# Patient Record
Sex: Male | Born: 1950 | Race: White | Hispanic: No | Marital: Single | Smoking: Never smoker
Health system: Southern US, Community
[De-identification: ages and names within clinical notes are randomized; demographics above are authoritative.]

## PROBLEM LIST (undated history)

## (undated) DIAGNOSIS — C439 Malignant melanoma of skin, unspecified: Secondary | ICD-10-CM

## (undated) DIAGNOSIS — Z85820 Personal history of malignant melanoma of skin: Secondary | ICD-10-CM

## (undated) DIAGNOSIS — H409 Unspecified glaucoma: Secondary | ICD-10-CM

## (undated) DIAGNOSIS — Z86018 Personal history of other benign neoplasm: Secondary | ICD-10-CM

## (undated) DIAGNOSIS — H269 Unspecified cataract: Secondary | ICD-10-CM

## (undated) DIAGNOSIS — I10 Essential (primary) hypertension: Secondary | ICD-10-CM

## (undated) DIAGNOSIS — Z85828 Personal history of other malignant neoplasm of skin: Secondary | ICD-10-CM

## (undated) DIAGNOSIS — M1990 Unspecified osteoarthritis, unspecified site: Secondary | ICD-10-CM

## (undated) HISTORY — PX: CHOLECYSTECTOMY: SHX55

## (undated) HISTORY — PX: CORONARY ARTERY BYPASS GRAFT: SHX141

## (undated) HISTORY — PX: GLAUCOMA SURGERY: SHX656

## (undated) HISTORY — PX: AQUEOUS SHUNT: SHX6305

## (undated) HISTORY — PX: EYE SURGERY: SHX253

---
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
## 2002-11-07 DIAGNOSIS — Z85820 Personal history of malignant melanoma of skin: Secondary | ICD-10-CM

## 2002-11-07 DIAGNOSIS — C439 Malignant melanoma of skin, unspecified: Secondary | ICD-10-CM

## 2002-11-07 HISTORY — DX: Personal history of malignant melanoma of skin: Z85.820

## 2002-11-07 HISTORY — DX: Malignant melanoma of skin, unspecified: C43.9

## 2008-05-14 ENCOUNTER — Ambulatory Visit: Payer: Self-pay | Admitting: Family Medicine

## 2008-05-14 ENCOUNTER — Other Ambulatory Visit: Payer: Self-pay

## 2008-05-19 ENCOUNTER — Ambulatory Visit: Payer: Self-pay | Admitting: Urology

## 2008-05-20 ENCOUNTER — Emergency Department: Payer: Self-pay | Admitting: Urology

## 2008-08-08 ENCOUNTER — Ambulatory Visit: Payer: Self-pay | Admitting: Unknown Physician Specialty

## 2008-08-11 ENCOUNTER — Ambulatory Visit: Payer: Self-pay | Admitting: Unknown Physician Specialty

## 2008-11-07 DIAGNOSIS — Z86018 Personal history of other benign neoplasm: Secondary | ICD-10-CM

## 2008-11-07 HISTORY — DX: Personal history of other benign neoplasm: Z86.018

## 2008-11-19 ENCOUNTER — Ambulatory Visit: Payer: Self-pay | Admitting: Unknown Physician Specialty

## 2008-12-24 DIAGNOSIS — D239 Other benign neoplasm of skin, unspecified: Secondary | ICD-10-CM

## 2008-12-24 HISTORY — DX: Other benign neoplasm of skin, unspecified: D23.9

## 2009-09-24 IMAGING — CT CT ABD-PELV W/ CM
1 of 2 series · 15 of 32 positions shown, 19 images · non-contrast
Comparison: none

REASON FOR EXAM: Right and left lower quadrant abdominal pain
COMMENTS:

[Series 2: abdomen · axial · 0.67mm/px · z∈[-1131,-706]mm · 15 of 93 slices shown, 19 images]
[im 4/93  soft-tissue]
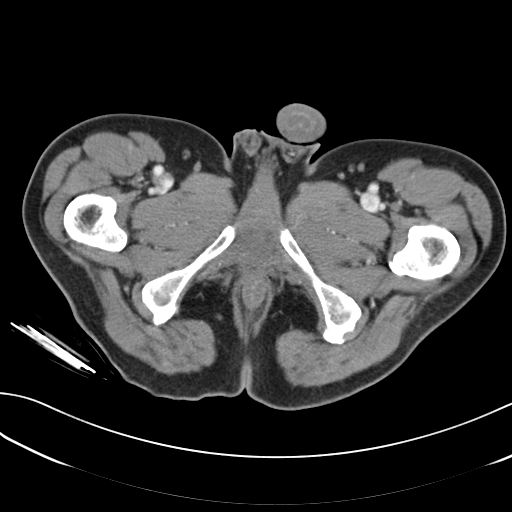
[im 4/93  bone]
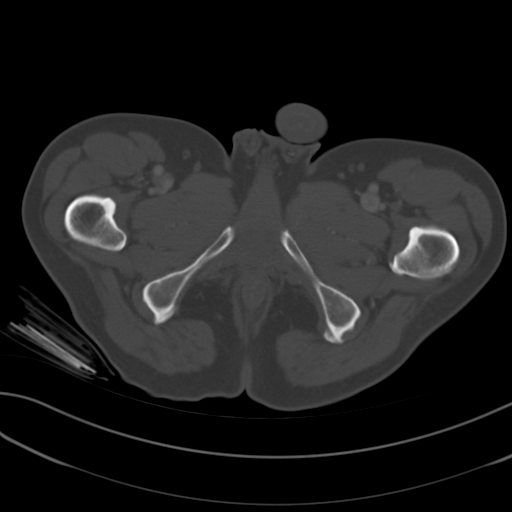
[im 12/93  soft-tissue]
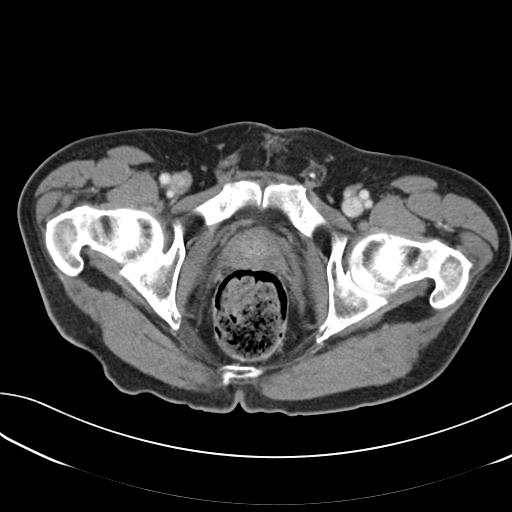
[im 20/93  soft-tissue]
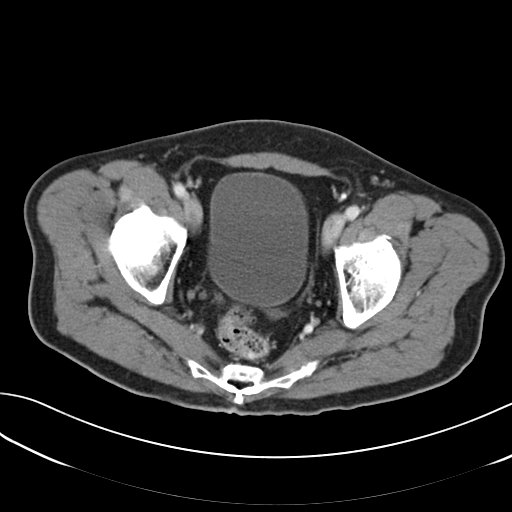
[im 27/93  soft-tissue]
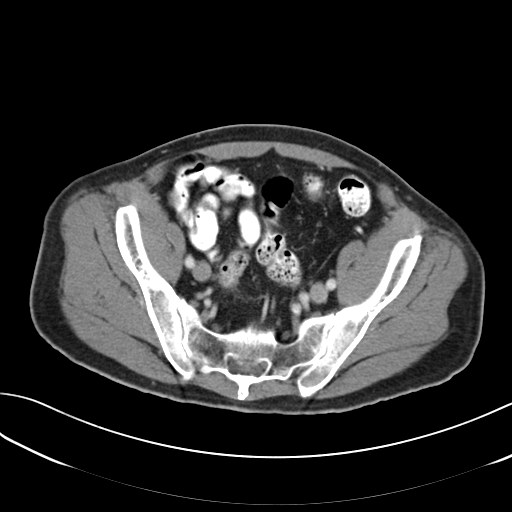
[im 31/93  soft-tissue]
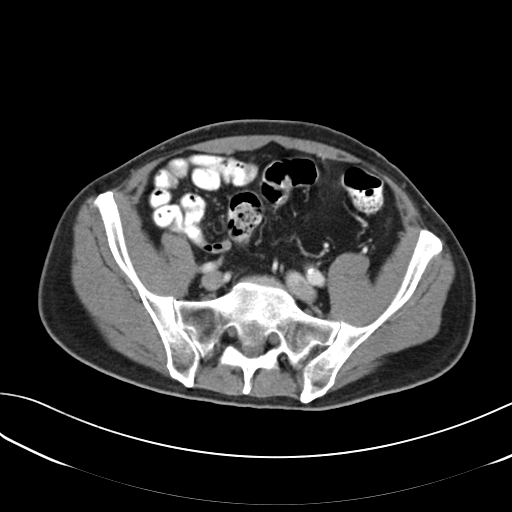
[im 39/93  soft-tissue]
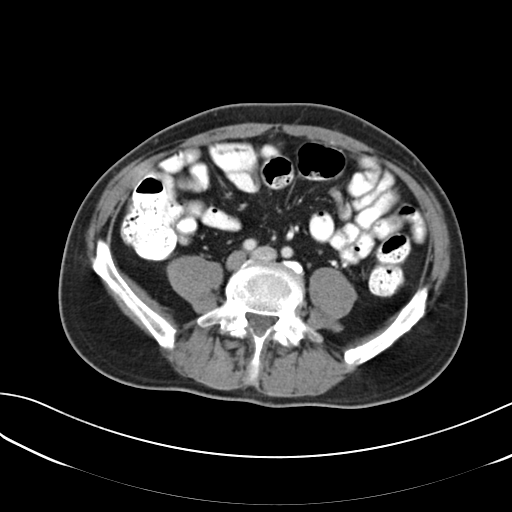
[im 47/93  soft-tissue]
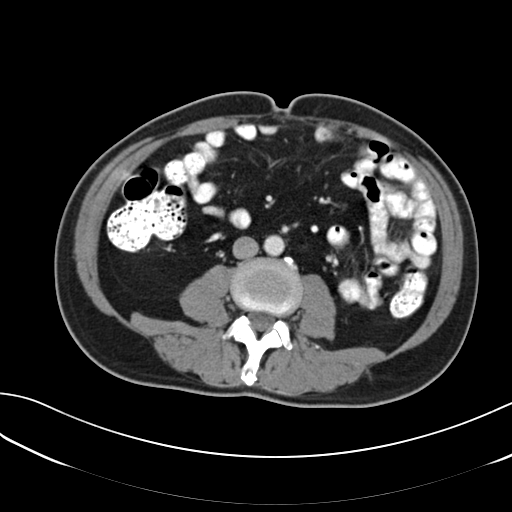
[im 54/93  soft-tissue]
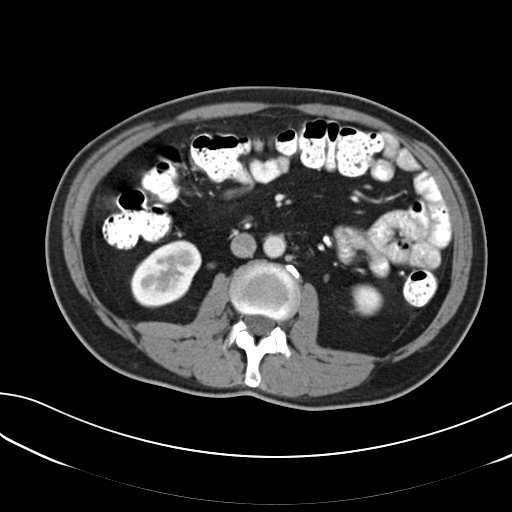
[im 62/93  soft-tissue]
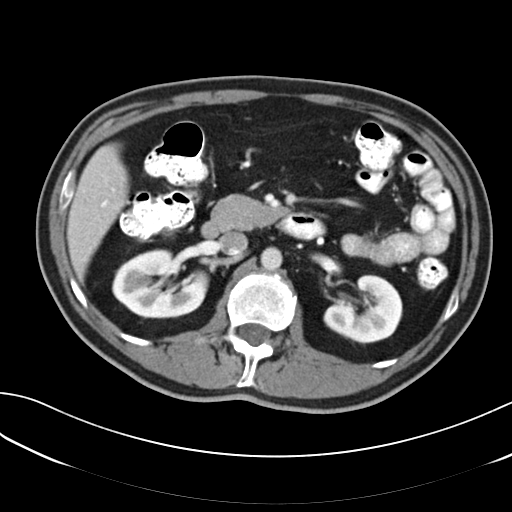
[im 62/93  bone]
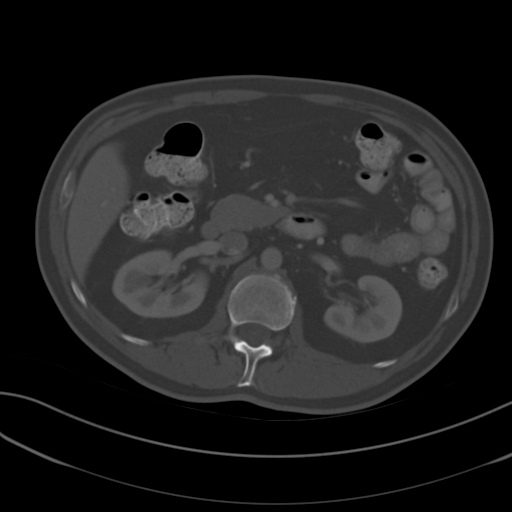
[im 66/93  soft-tissue]
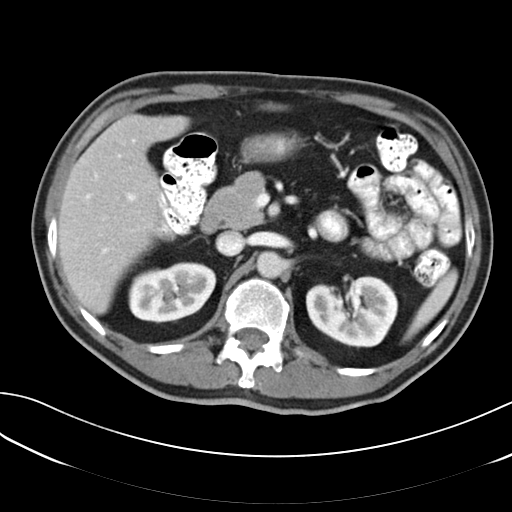
[im 73/93  soft-tissue]
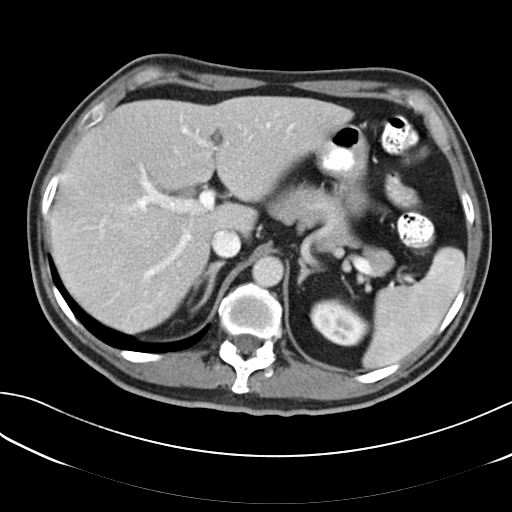
[im 77/93  lung]
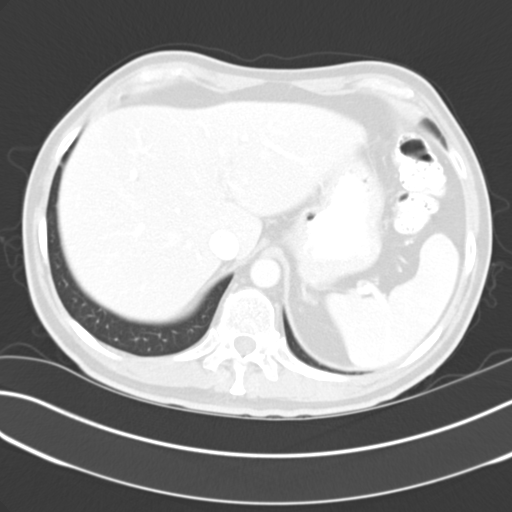
[im 81/93  soft-tissue]
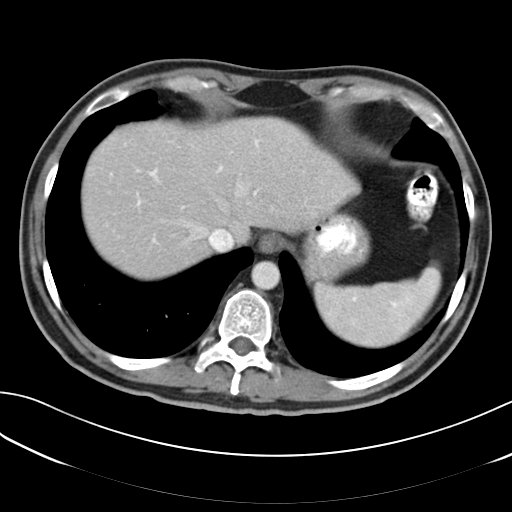
[im 81/93  lung]
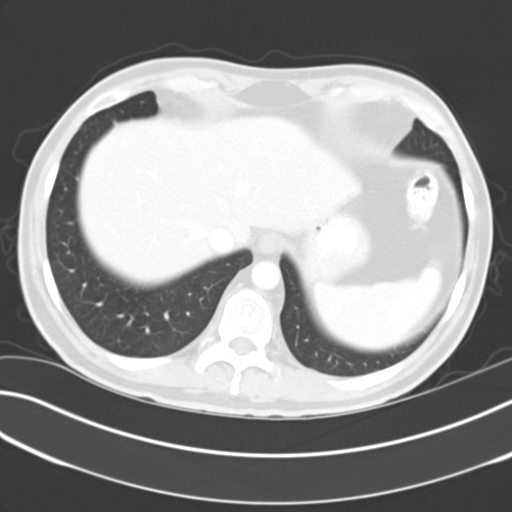
[im 85/93  lung]
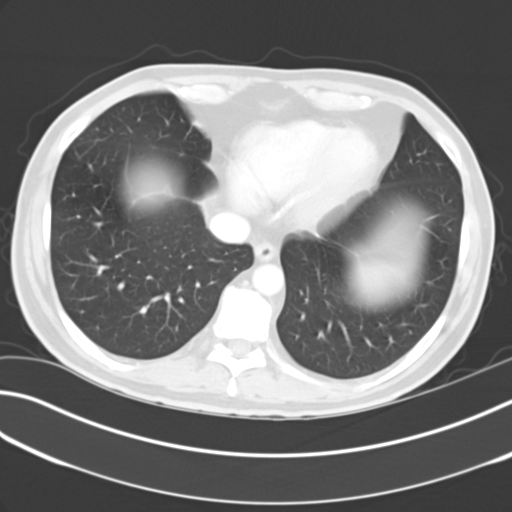
[im 89/93  soft-tissue]
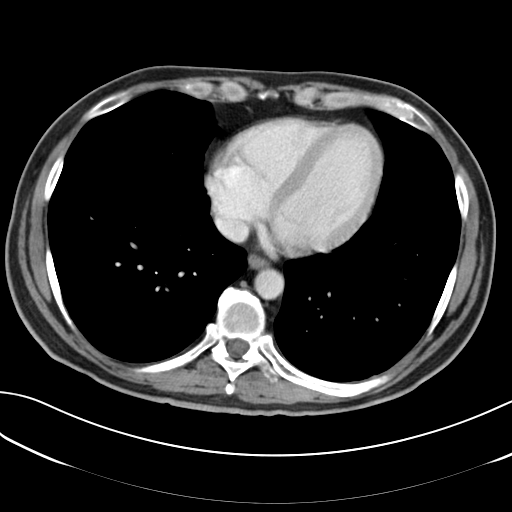
[im 89/93  lung]
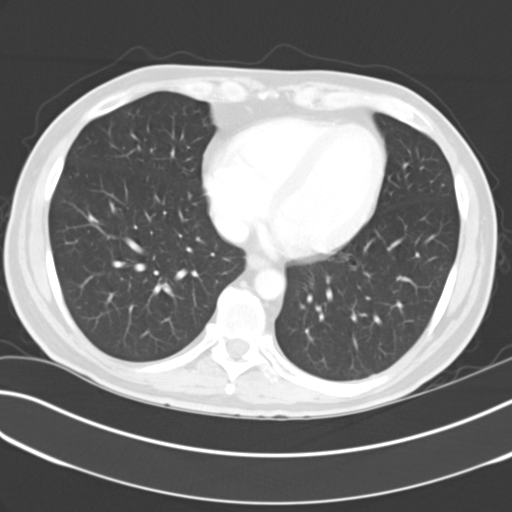

[15 of 32 positions shown; findings below may reference images not displayed]

PROCEDURE:     CT  - CT ABDOMEN / PELVIS  W  - August 11, 2008  [DATE]

RESULT:     The patient is being evaluated for unexplained constipation.

The patient received 85 ml of [id number] as well as oral contrast material.

There is a moderate amount of stool and gas and contrast within the colon.
Stool is present down to and including the rectum. I do not see abnormal
masses or wall thickening associated with the colon. There are no findings
to suggest an inguinal or umbilical hernia. The partially contrast filled
loops of small bowel are normal in appearance.

The liver exhibits normal density. There is a trace of intrahepatic ductal
dilation. The gallbladder is surgically absent. The pancreas, spleen,
adrenal glands, kidneys and partially distended stomach are normal in
appearance. The caliber of the abdominal aorta is normal. The lumbar
vertebral bodies are preserved in height. The urinary bladder, prostate
gland and seminal vesicles appear normal for age. The lung bases are clear.
IMPRESSION: 1.  I do not see evidence of bowel obstruction or bowel wall thickening. No
inguinal or umbilical hernia is evident. There is a moderate amount of stool
throughout the colon.
2.  I do not see evidence of acute hepatobiliary abnormality or acute
urinary tract abnormality.
3.  There is no evidence of ascites.
4.  Direct visualization of the colon may be useful if the patient has not
already undergone this.

## 2011-11-08 DIAGNOSIS — Z85828 Personal history of other malignant neoplasm of skin: Secondary | ICD-10-CM

## 2011-11-08 HISTORY — DX: Personal history of other malignant neoplasm of skin: Z85.828

## 2015-03-05 DIAGNOSIS — C4491 Basal cell carcinoma of skin, unspecified: Secondary | ICD-10-CM

## 2015-03-05 HISTORY — DX: Basal cell carcinoma of skin, unspecified: C44.91

## 2017-05-11 ENCOUNTER — Other Ambulatory Visit: Payer: Self-pay | Admitting: Cardiology

## 2017-05-12 ENCOUNTER — Encounter
Admission: RE | Disposition: A | Discharge status: Home / Self Care | Payer: Self-pay | Source: Ambulatory Visit | Attending: Cardiology

## 2017-05-12 ENCOUNTER — Ambulatory Visit
Admission: RE | Admit: 2017-05-12 | Discharge: 2017-05-13 | Disposition: A | Discharge status: Home / Self Care | Payer: Medicare Other | Source: Ambulatory Visit | Attending: Cardiology | Admitting: Cardiology

## 2017-05-12 DIAGNOSIS — Z7982 Long term (current) use of aspirin: Secondary | ICD-10-CM | POA: Diagnosis not present

## 2017-05-12 DIAGNOSIS — Z7902 Long term (current) use of antithrombotics/antiplatelets: Secondary | ICD-10-CM | POA: Insufficient documentation

## 2017-05-12 DIAGNOSIS — Z880 Allergy status to penicillin: Secondary | ICD-10-CM | POA: Insufficient documentation

## 2017-05-12 DIAGNOSIS — I25110 Atherosclerotic heart disease of native coronary artery with unstable angina pectoris: Secondary | ICD-10-CM | POA: Insufficient documentation

## 2017-05-12 DIAGNOSIS — E781 Pure hyperglyceridemia: Secondary | ICD-10-CM | POA: Insufficient documentation

## 2017-05-12 DIAGNOSIS — I208 Other forms of angina pectoris: Secondary | ICD-10-CM | POA: Diagnosis present

## 2017-05-12 DIAGNOSIS — R9430 Abnormal result of cardiovascular function study, unspecified: Secondary | ICD-10-CM | POA: Diagnosis not present

## 2017-05-12 DIAGNOSIS — I2510 Atherosclerotic heart disease of native coronary artery without angina pectoris: Secondary | ICD-10-CM | POA: Diagnosis not present

## 2017-05-12 DIAGNOSIS — I2089 Other forms of angina pectoris: Secondary | ICD-10-CM | POA: Diagnosis present

## 2017-05-12 HISTORY — DX: Malignant melanoma of skin, unspecified: C43.9

## 2017-05-12 HISTORY — DX: Unspecified cataract: H26.9

## 2017-05-12 HISTORY — DX: Unspecified glaucoma: H40.9

## 2017-05-12 HISTORY — DX: Unspecified osteoarthritis, unspecified site: M19.90

## 2017-05-12 HISTORY — PX: LEFT HEART CATH AND CORONARY ANGIOGRAPHY: CATH118249

## 2017-05-12 HISTORY — PX: CORONARY STENT INTERVENTION: CATH118234

## 2017-05-12 HISTORY — DX: Essential (primary) hypertension: I10

## 2017-05-12 LAB — CBC
HCT: 44 % (ref 40.0–52.0)
Hemoglobin: 15.2 g/dL (ref 13.0–18.0)
MCH: 32.1 pg (ref 26.0–34.0)
MCHC: 34.7 g/dL (ref 32.0–36.0)
MCV: 92.7 fL (ref 80.0–100.0)
PLATELETS: 193 10*3/uL (ref 150–440)
RBC: 4.75 MIL/uL (ref 4.40–5.90)
RDW: 13 % (ref 11.5–14.5)
WBC: 7.2 10*3/uL (ref 3.8–10.6)

## 2017-05-12 LAB — BASIC METABOLIC PANEL
Anion gap: 9 (ref 5–15)
BUN: 19 mg/dL (ref 6–20)
CO2: 25 mmol/L (ref 22–32)
CREATININE: 0.84 mg/dL (ref 0.61–1.24)
Calcium: 9 mg/dL (ref 8.9–10.3)
Chloride: 102 mmol/L (ref 101–111)
Glucose, Bld: 109 mg/dL — ABNORMAL HIGH (ref 65–99)
Potassium: 4.2 mmol/L (ref 3.5–5.1)
SODIUM: 136 mmol/L (ref 135–145)

## 2017-05-12 SURGERY — LEFT HEART CATH AND CORONARY ANGIOGRAPHY
Anesthesia: Moderate Sedation

## 2017-05-12 MED ORDER — SAW PALMETTO 450 MG PO CAPS: 450.0000 mg | ORAL_CAPSULE | Freq: Every day | ORAL | Status: DC

## 2017-05-12 MED ORDER — IOPAMIDOL (ISOVUE-300) INJECTION 61%
INTRAVENOUS | Status: DC | PRN
  Administered 2017-05-12: 110 mL via INTRA_ARTERIAL

## 2017-05-12 MED ORDER — ASPIRIN 81 MG PO CHEW
CHEWABLE_TABLET | ORAL | Status: DC | PRN
  Administered 2017-05-12: 324 mg via ORAL

## 2017-05-12 MED ORDER — BIVALIRUDIN TRIFLUOROACETATE 250 MG IV SOLR
INTRAVENOUS | Status: AC
  Filled 2017-05-12: qty 250

## 2017-05-12 MED ORDER — ASPIRIN 81 MG PO CHEW
81.0000 mg | CHEWABLE_TABLET | ORAL | Status: DC
Start: 2017-05-13 — End: 2017-05-12

## 2017-05-12 MED ORDER — CLOPIDOGREL BISULFATE 300 MG PO TABS
ORAL_TABLET | ORAL | Status: AC
  Filled 2017-05-12: qty 2

## 2017-05-12 MED ORDER — BRINZOLAMIDE 1 % OP SUSP
1.0000 [drp] | Freq: Three times a day (TID) | OPHTHALMIC | Status: DC
  Administered 2017-05-12 – 2017-05-13 (×3): 1 [drp] via OPHTHALMIC
  Filled 2017-05-12: qty 10

## 2017-05-12 MED ORDER — CLOPIDOGREL BISULFATE 75 MG PO TABS
ORAL_TABLET | ORAL | Status: DC | PRN
  Administered 2017-05-12: 600 mg via ORAL

## 2017-05-12 MED ORDER — NITROGLYCERIN 5 MG/ML IV SOLN
INTRAVENOUS | Status: AC
  Filled 2017-05-12: qty 10

## 2017-05-12 MED ORDER — BIVALIRUDIN BOLUS VIA INFUSION - CUPID
INTRAVENOUS | Status: DC | PRN
  Administered 2017-05-12 (×2): 56.85 mg via INTRAVENOUS

## 2017-05-12 MED ORDER — TEMAZEPAM 15 MG PO CAPS
15.0000 mg | ORAL_CAPSULE | Freq: Every day | ORAL | Status: DC
  Filled 2017-05-12: qty 1

## 2017-05-12 MED ORDER — SODIUM CHLORIDE 0.9% FLUSH
3.0000 mL | Freq: Two times a day (BID) | INTRAVENOUS | Status: DC
Start: 2017-05-12 — End: 2017-05-13
  Administered 2017-05-13: 3 mL via INTRAVENOUS

## 2017-05-12 MED ORDER — ASPIRIN 81 MG PO CHEW
CHEWABLE_TABLET | ORAL | Status: AC
  Filled 2017-05-12: qty 4

## 2017-05-12 MED ORDER — ENALAPRIL MALEATE 10 MG PO TABS
10.0000 mg | ORAL_TABLET | Freq: Two times a day (BID) | ORAL | Status: DC
  Administered 2017-05-12 – 2017-05-13 (×3): 10 mg via ORAL
  Filled 2017-05-12 (×3): qty 1

## 2017-05-12 MED ORDER — MIRTAZAPINE 15 MG PO TABS
7.5000 mg | ORAL_TABLET | Freq: Every evening | ORAL | Status: DC | PRN
  Filled 2017-05-12: qty 1

## 2017-05-12 MED ORDER — ATORVASTATIN CALCIUM 20 MG PO TABS
20.0000 mg | ORAL_TABLET | Freq: Every day | ORAL | Status: DC
  Administered 2017-05-12: 20 mg via ORAL
  Filled 2017-05-12 (×2): qty 1

## 2017-05-12 MED ORDER — SERTRALINE HCL 50 MG PO TABS
150.0000 mg | ORAL_TABLET | Freq: Every day | ORAL | Status: DC
  Administered 2017-05-12 – 2017-05-13 (×2): 150 mg via ORAL
  Filled 2017-05-12 (×2): qty 1

## 2017-05-12 MED ORDER — FENTANYL CITRATE (PF) 100 MCG/2ML IJ SOLN
INTRAMUSCULAR | Status: AC
  Filled 2017-05-12: qty 2

## 2017-05-12 MED ORDER — OXYMETAZOLINE HCL 0.05 % NA SOLN
1.0000 | Freq: Every day | NASAL | Status: DC
  Filled 2017-05-12: qty 15

## 2017-05-12 MED ORDER — TRIAZOLAM 0.25 MG PO TABS
0.5000 mg | ORAL_TABLET | Freq: Every evening | ORAL | Status: DC | PRN
  Administered 2017-05-12: 0.5 mg via ORAL
  Filled 2017-05-12: qty 2

## 2017-05-12 MED ORDER — SODIUM CHLORIDE 0.9% FLUSH: 3.0000 mL | INTRAVENOUS | Status: DC | PRN

## 2017-05-12 MED ORDER — SODIUM CHLORIDE 0.9 % IV SOLN: 250.0000 mL | INTRAVENOUS | Status: DC | PRN

## 2017-05-12 MED ORDER — SODIUM CHLORIDE 0.9 % IV SOLN
INTRAVENOUS | Status: DC | PRN
  Administered 2017-05-12: 1.75 mg/kg/h via INTRAVENOUS

## 2017-05-12 MED ORDER — ATENOLOL 25 MG PO TABS
25.0000 mg | ORAL_TABLET | Freq: Two times a day (BID) | ORAL | Status: DC
  Administered 2017-05-12 – 2017-05-13 (×3): 25 mg via ORAL
  Filled 2017-05-12 (×3): qty 1

## 2017-05-12 MED ORDER — FENTANYL CITRATE (PF) 100 MCG/2ML IJ SOLN
INTRAMUSCULAR | Status: DC | PRN
  Administered 2017-05-12 (×2): 25 ug via INTRAVENOUS

## 2017-05-12 MED ORDER — SODIUM CHLORIDE 0.9 % WEIGHT BASED INFUSION: 1.0000 mL/kg/h | INTRAVENOUS | Status: AC

## 2017-05-12 MED ORDER — IOPAMIDOL (ISOVUE-300) INJECTION 61%
INTRAVENOUS | Status: DC | PRN
  Administered 2017-05-12: 80 mL via INTRA_ARTERIAL

## 2017-05-12 MED ORDER — SODIUM CHLORIDE 0.9 % WEIGHT BASED INFUSION
3.0000 mL/kg/h | INTRAVENOUS | Status: DC
  Administered 2017-05-12: 3 mL/kg/h via INTRAVENOUS

## 2017-05-12 MED ORDER — SODIUM CHLORIDE 0.9% FLUSH
3.0000 mL | Freq: Two times a day (BID) | INTRAVENOUS | Status: DC
  Administered 2017-05-12: 3 mL via INTRAVENOUS

## 2017-05-12 MED ORDER — SODIUM CHLORIDE 0.9 % WEIGHT BASED INFUSION: 1.0000 mL/kg/h | INTRAVENOUS | Status: DC

## 2017-05-12 MED ORDER — ONDANSETRON HCL 4 MG/2ML IJ SOLN: 4.0000 mg | Freq: Four times a day (QID) | INTRAMUSCULAR | Status: DC | PRN

## 2017-05-12 MED ORDER — ASPIRIN EC 81 MG PO TBEC
81.0000 mg | DELAYED_RELEASE_TABLET | Freq: Every day | ORAL | Status: DC
  Administered 2017-05-13: 81 mg via ORAL
  Filled 2017-05-12: qty 1

## 2017-05-12 MED ORDER — MIDAZOLAM HCL 2 MG/2ML IJ SOLN
INTRAMUSCULAR | Status: DC | PRN
  Administered 2017-05-12 (×2): 1 mg via INTRAVENOUS

## 2017-05-12 MED ORDER — ALPRAZOLAM 1 MG PO TABS
1.0000 mg | ORAL_TABLET | Freq: Two times a day (BID) | ORAL | Status: DC | PRN
  Administered 2017-05-12 (×2): 1 mg via ORAL
  Filled 2017-05-12 (×2): qty 1

## 2017-05-12 MED ORDER — GEMFIBROZIL 600 MG PO TABS
600.0000 mg | ORAL_TABLET | Freq: Every day | ORAL | Status: DC
  Administered 2017-05-12 – 2017-05-13 (×2): 600 mg via ORAL
  Filled 2017-05-12 (×2): qty 1

## 2017-05-12 MED ORDER — CLOPIDOGREL BISULFATE 75 MG PO TABS
75.0000 mg | ORAL_TABLET | Freq: Every day | ORAL | Status: DC
  Administered 2017-05-13: 75 mg via ORAL
  Filled 2017-05-12: qty 1

## 2017-05-12 MED ORDER — ACETAMINOPHEN 325 MG PO TABS: 650.0000 mg | ORAL_TABLET | ORAL | Status: DC | PRN

## 2017-05-12 MED ORDER — MIDAZOLAM HCL 2 MG/2ML IJ SOLN
INTRAMUSCULAR | Status: AC
  Filled 2017-05-12: qty 2

## 2017-05-12 MED ORDER — TRIAZOLAM 0.25 MG PO TABS: 0.5000 mg | ORAL_TABLET | Freq: Every day | ORAL | Status: DC

## 2017-05-12 MED ORDER — ADULT MULTIVITAMIN W/MINERALS CH
1.0000 | ORAL_TABLET | Freq: Every day | ORAL | Status: DC
  Administered 2017-05-12 – 2017-05-13 (×2): 1 via ORAL
  Filled 2017-05-12 (×2): qty 1

## 2017-05-12 MED ORDER — NITROGLYCERIN 1 MG/10 ML FOR IR/CATH LAB
INTRA_ARTERIAL | Status: DC | PRN
  Administered 2017-05-12: 200 ug via INTRA_ARTERIAL
  Administered 2017-05-12: 200 ug via INTRACORONARY

## 2017-05-12 MED ORDER — LATANOPROST 0.005 % OP SOLN
1.0000 [drp] | Freq: Every day | OPHTHALMIC | Status: DC
  Administered 2017-05-12: 1 [drp] via OPHTHALMIC
  Filled 2017-05-12: qty 2.5

## 2017-05-12 SURGICAL SUPPLY — 21 items
BALLN TREK RX 2.5X12 (BALLOONS) ×2
BALLN ~~LOC~~ TREK RX 3.0X15 (BALLOONS) ×2
BALLOON TREK RX 2.5X12 (BALLOONS) ×1 IMPLANT
BALLOON ~~LOC~~ TREK RX 3.0X15 (BALLOONS) ×1 IMPLANT
CATH INFINITI 5FR ANG PIGTAIL (CATHETERS) ×2 IMPLANT
CATH INFINITI 5FR JL4 (CATHETERS) ×2 IMPLANT
CATH INFINITI JR4 5F (CATHETERS) ×2 IMPLANT
CATH VISTA GUIDE 6FR JR4 (CATHETERS) ×2 IMPLANT
CATH VISTA GUIDE 6FR XBLAD4 (CATHETERS) ×2 IMPLANT
DEVICE CLOSURE MYNXGRIP 6/7F (Vascular Products) ×2 IMPLANT
DEVICE INFLAT 30 PLUS (MISCELLANEOUS) ×2 IMPLANT
KIT MANI 3VAL PERCEP (MISCELLANEOUS) ×2 IMPLANT
NEEDLE PERC 18GX7CM (NEEDLE) ×2 IMPLANT
PACK CARDIAC CATH (CUSTOM PROCEDURE TRAY) ×2 IMPLANT
SHEATH AVANTI 5FR X 11CM (SHEATH) ×2 IMPLANT
SHEATH AVANTI 6FR X 11CM (SHEATH) ×2 IMPLANT
STENT XIENCE ALPINE RX 2.75X18 (Permanent Stent) ×2 IMPLANT
STENT XIENCE ALPINE RX 3.5X12 (Permanent Stent) ×2 IMPLANT
WIRE EMERALD 3MM-J .035X150CM (WIRE) ×2 IMPLANT
WIRE EMERALD ST .035X150CM (WIRE) ×2 IMPLANT
WIRE INTUITION PROPEL ST 180CM (WIRE) ×2 IMPLANT

## 2017-05-12 NOTE — Progress Notes (Signed)
Admitted from specials post PCI,right groin site is clean and soft without pain and hematoma,ambulating now.

## 2017-05-13 DIAGNOSIS — I25110 Atherosclerotic heart disease of native coronary artery with unstable angina pectoris: Secondary | ICD-10-CM | POA: Diagnosis not present

## 2017-05-13 LAB — CBC
HCT: 42.1 % (ref 40.0–52.0)
Hemoglobin: 14.9 g/dL (ref 13.0–18.0)
MCH: 32.3 pg (ref 26.0–34.0)
MCHC: 35.3 g/dL (ref 32.0–36.0)
MCV: 91.5 fL (ref 80.0–100.0)
Platelets: 166 K/uL (ref 150–440)
RBC: 4.61 MIL/uL (ref 4.40–5.90)
RDW: 12.8 % (ref 11.5–14.5)
WBC: 6.7 K/uL (ref 3.8–10.6)

## 2017-05-13 LAB — BASIC METABOLIC PANEL WITH GFR
Anion gap: 6 (ref 5–15)
BUN: 18 mg/dL (ref 6–20)
CO2: 26 mmol/L (ref 22–32)
Calcium: 8.8 mg/dL — ABNORMAL LOW (ref 8.9–10.3)
Chloride: 104 mmol/L (ref 101–111)
Creatinine, Ser: 0.78 mg/dL (ref 0.61–1.24)
GFR calc Af Amer: >60 mL/min
GFR calc non Af Amer: >60 mL/min
Glucose, Bld: 105 mg/dL — ABNORMAL HIGH (ref 65–99)
Potassium: 4.1 mmol/L (ref 3.5–5.1)
Sodium: 136 mmol/L (ref 135–145)

## 2017-05-13 MED ORDER — CLOPIDOGREL BISULFATE 75 MG PO TABS: 75.0000 mg | ORAL_TABLET | Freq: Every day | ORAL | 11 refills | Status: DC

## 2017-05-13 MED ORDER — ATORVASTATIN CALCIUM 20 MG PO TABS: 20.0000 mg | ORAL_TABLET | Freq: Every day | ORAL | 11 refills | Status: AC

## 2017-05-13 NOTE — Discharge Summary (Signed)
Physician Discharge Summary  Patient ID: Benjerman Molinelli MRN: [id number] DOB/AGE: 07/11/1951 66 y.o.  Admit date: 05/12/2017 Discharge date: 05/13/2017  Admission Diagnoses:  Discharge Diagnoses:  Active Problems:   Nonspecific abnormal function study, cardiovascular   Effort angina [facility])   Discharged Condition: good  Hospital Course: Patient is a 66 year old male with no prior cardiac history. He began developing exertional chest pain. Outpatient functional study was abnormal for inferolateral and inferoseptal ischemia. Patient underwent left cardiac catheterization which revealed a 95% proximal RCA as well as a 95% proximal LAD. He underwent placement of drug-eluting stents in both the LAD and RCA without complication. He was placed on Plavix, and to need with aspirin and started on atorvastatin 20 mg daily. Patient is on Lopid for hypertriglyceridemia. He had been on beta blockers and enalapril as an outpatient. He has done well post PCI. Catheter site in the right femoral location appears clean and dry with no hematoma or bruit. Patient had no chest pain. Is hemodynamically stable. Renal function is normal.  Consults: None  Significant Diagnostic Studies: labs: Renal function  Treatments: IV hydration  Discharge Exam: Blood pressure 123/69, pulse (!) 59, temperature 98.1 F (36.7 C), temperature source Oral, resp. rate 16, height 5\' 7"  (1.702 m), weight 75.8 kg (167 lb), SpO2 98 %. General appearance: alert and cooperative Head: Normocephalic, without obvious abnormality, atraumatic Resp: clear to auscultation bilaterally Cardio: regular rate and rhythm GI: soft, non-tender; bowel sounds normal; no masses,  no organomegaly Extremities: extremities normal, atraumatic, no cyanosis or edema Pulses: 2+ and symmetric Neurologic: Grossly normal  Disposition: Final discharge disposition not confirmed  Discharge Instructions    AMB Referral to Cardiac Rehabilitation - Phase II     Complete by:  As directed    Diagnosis:   Coronary Stents Stable Angina       Allergies as of 05/13/2017      Reactions   Prednisone Other (See Comments)   Other Reaction: increase IOP   Penicillins Rash   Has patient had a PCN reaction causing immediate rash, facial/tongue/throat swelling, SOB or lightheadedness with hypotension: No Has patient had a PCN reaction causing severe rash involving mucus membranes or skin necrosis: No Has patient had a PCN reaction that required hospitalization: No Has patient had a PCN reaction occurring within the last 10 years: No If all of the above answers are "NO", then may proceed with Cephalosporin use.   Brimonidine Other (See Comments)   Redness   Brimonidine Tartrate-timolol Other (See Comments)   redness   Dorzolamide Other (See Comments)   Redness   Dorzolamide Hcl-timolol Mal Pf Other (See Comments)   Redness   Latanoprost Other (See Comments)   redness   Timolol Maleate Other (See Comments)   redness      Medication List    TAKE these medications   ALPRAZolam 1 MG 24 hr tablet Commonly known as:  XANAX XR Take 1 mg by mouth 2 (two) times daily.   aspirin EC 81 MG tablet Take 81 mg by mouth daily.   atenolol 25 MG tablet Commonly known as:  TENORMIN Take 25 mg by mouth 2 (two) times daily.   atorvastatin 20 MG tablet Commonly known as:  LIPITOR Take 1 tablet (20 mg total) by mouth daily at 6 PM.   AZOPT 1 % ophthalmic suspension Generic drug:  brinzolamide Place 1 drop into both eyes 3 (three) times daily.   clopidogrel 75 MG tablet Commonly known as:  PLAVIX Take 1 tablet (  75 mg total) by mouth daily with breakfast.   enalapril 10 MG tablet Commonly known as:  VASOTEC Take 10 mg by mouth 2 (two) times daily.   gemfibrozil 600 MG tablet Commonly known as:  LOPID Take 600 mg by mouth daily.   mirtazapine 15 MG tablet Commonly known as:  REMERON Take 7.5 mg by mouth at bedtime as needed (sleep).   multivitamin  with minerals Tabs tablet Take 1 tablet by mouth daily.   oxymetazoline 0.05 % nasal spray Commonly known as:  AFRIN Place 1 spray into both nostrils at bedtime.   Saw Palmetto 450 MG Caps Take 450 mg by mouth daily.   sertraline 100 MG tablet Commonly known as:  ZOLOFT Take 150 mg by mouth daily. Takes 1.5 tablet   TRAVATAN Z 0.004 % Soln ophthalmic solution Generic drug:  Travoprost (BAK Free) Place 1 drop into both eyes at bedtime.   triazolam 0.25 MG tablet Commonly known as:  HALCION Take 0.5 mg by mouth at bedtime. Takes 2 tablets at bedtime   triazolam 0.25 MG tablet Commonly known as:  HALCION Take by mouth.      Follow-up Information    Teodoro Spray, MD Follow up in 1 week(s).   Specialty:  Cardiology Contact information: [place] [address] [phone]           Signed: Teodoro Spray 05/13/2017, 8:31 AM

## 2017-05-13 NOTE — Progress Notes (Signed)
Patient discharged home as ordered,instructions explained and well understood,prescriptions given,escorted by staff member and family via wheel chair.

## 2017-05-13 NOTE — Care Management Obs Status (Signed)
[facility] NOTIFICATION   Patient Details  Name: Markey Deady MRN: [id number] Date of Birth: Oct 17, 1951   Medicare Observation Status Notification Given:  No [facility] letter)  Discharged in less than 24 hours    Lucien Budney A, RN 05/13/2017, 10:13 AM

## 2017-05-13 NOTE — Final Progress Note (Signed)
Physician Final Progress Note  Patient ID: Tommy Golden MRN: [id number] DOB/AGE: Dec 05, 1950 66 y.o.  Admit date: 05/12/2017 Admitting provider: Teodoro Spray, MD Discharge date: 05/13/2017   Admission Diagnoses: Unstable angina  Discharge Diagnoses:  Active Problems:   Nonspecific abnormal function study, cardiovascular   Effort angina (HCC)  Two-vessel coronary artery disease  Consults: None  Significant Findings/ Diagnostic Studies: Cardiac catheterization revealed 2 vessel coronary disease with a 95% proximal RCA and a 95% LAD. These were stented with drug-eluting stents. He has done well post cardiac catheter and PCI. He remains on dual antiplatelet therapy with aspirin and Plavix. He was started on low-dose atorvastatin in addition to his previously prescribed gemfibrozil. He will continue with his atenolol and enalapril. Doing well ambulating discharged today with outpatient follow-up in one week  Procedures: Left cardiac catheter and PCI of RCA and LAD  Discharge Condition: good  Disposition: Final discharge disposition not confirmed  Diet: Cardiac diet  Discharge Activity: No lifting, driving, or strenuous exercise for 48 hours  Discharge Instructions    AMB Referral to Cardiac Rehabilitation - Phase II    Complete by:  As directed    Diagnosis:   Coronary Stents Stable Angina       Allergies as of 05/13/2017      Reactions   Prednisone Other (See Comments)   Other Reaction: increase IOP   Penicillins Rash   Has patient had a PCN reaction causing immediate rash, facial/tongue/throat swelling, SOB or lightheadedness with hypotension: No Has patient had a PCN reaction causing severe rash involving mucus membranes or skin necrosis: No Has patient had a PCN reaction that required hospitalization: No Has patient had a PCN reaction occurring within the last 10 years: No If all of the above answers are "NO", then may proceed with Cephalosporin use.   Brimonidine Other  (See Comments)   Redness   Brimonidine Tartrate-timolol Other (See Comments)   redness   Dorzolamide Other (See Comments)   Redness   Dorzolamide Hcl-timolol Mal Pf Other (See Comments)   Redness   Latanoprost Other (See Comments)   redness   Timolol Maleate Other (See Comments)   redness      Medication List    TAKE these medications   ALPRAZolam 1 MG 24 hr tablet Commonly known as:  XANAX XR Take 1 mg by mouth 2 (two) times daily.   aspirin EC 81 MG tablet Take 81 mg by mouth daily.   atenolol 25 MG tablet Commonly known as:  TENORMIN Take 25 mg by mouth 2 (two) times daily.   atorvastatin 20 MG tablet Commonly known as:  LIPITOR Take 1 tablet (20 mg total) by mouth daily at 6 PM.   AZOPT 1 % ophthalmic suspension Generic drug:  brinzolamide Place 1 drop into both eyes 3 (three) times daily.   clopidogrel 75 MG tablet Commonly known as:  PLAVIX Take 1 tablet (75 mg total) by mouth daily with breakfast.   enalapril 10 MG tablet Commonly known as:  VASOTEC Take 10 mg by mouth 2 (two) times daily.   gemfibrozil 600 MG tablet Commonly known as:  LOPID Take 600 mg by mouth daily.   mirtazapine 15 MG tablet Commonly known as:  REMERON Take 7.5 mg by mouth at bedtime as needed (sleep).   multivitamin with minerals Tabs tablet Take 1 tablet by mouth daily.   oxymetazoline 0.05 % nasal spray Commonly known as:  AFRIN Place 1 spray into both nostrils at bedtime.  Saw Palmetto 450 MG Caps Take 450 mg by mouth daily.   sertraline 100 MG tablet Commonly known as:  ZOLOFT Take 150 mg by mouth daily. Takes 1.5 tablet   TRAVATAN Z 0.004 % Soln ophthalmic solution Generic drug:  Travoprost (BAK Free) Place 1 drop into both eyes at bedtime.   triazolam 0.25 MG tablet Commonly known as:  HALCION Take 0.5 mg by mouth at bedtime. Takes 2 tablets at bedtime   triazolam 0.25 MG tablet Commonly known as:  HALCION Take by mouth.      Follow-up Information     Teodoro Spray, MD Follow up in 1 week(s).   Specialty:  Cardiology Contact information: [place] [address] [phone]           Total time spent taking care of this patient: 30 minutes  Signed: Teodoro Spray 05/13/2017, 8:36 AM

## 2017-05-15 LAB — POCT ACTIVATED CLOTTING TIME: Activated Clotting Time: 345 seconds

## 2017-05-29 ENCOUNTER — Encounter: Payer: Self-pay | Admitting: *Deleted

## 2017-05-29 ENCOUNTER — Encounter: Payer: Medicare Other | Attending: Cardiovascular Disease | Admitting: *Deleted

## 2017-05-29 VITALS — Ht 67.3 in | Wt 165.3 lb

## 2017-05-29 DIAGNOSIS — I10 Essential (primary) hypertension: Secondary | ICD-10-CM | POA: Insufficient documentation

## 2017-05-29 DIAGNOSIS — Z7982 Long term (current) use of aspirin: Secondary | ICD-10-CM | POA: Diagnosis not present

## 2017-05-29 DIAGNOSIS — Z79899 Other long term (current) drug therapy: Secondary | ICD-10-CM | POA: Insufficient documentation

## 2017-05-29 DIAGNOSIS — Z7902 Long term (current) use of antithrombotics/antiplatelets: Secondary | ICD-10-CM | POA: Diagnosis not present

## 2017-05-29 DIAGNOSIS — Z955 Presence of coronary angioplasty implant and graft: Secondary | ICD-10-CM | POA: Insufficient documentation

## 2017-05-29 NOTE — Progress Notes (Signed)
Cardiac Individual Treatment Plan  Patient Details  Name: Tommy Golden MRN: [id number] Date of Birth: [birth year] Referring Provider:  Initial Encounter Date:  Visit Diagnosis: Status post coronary artery stent placement  Patient's Home Medications on Admission:  Current Outpatient Prescriptions:  .  ALPRAZolam (XANAX XR) 1 MG 24 hr tablet, Take 1 mg by mouth 2 (two) times daily., Disp: , Rfl:  .  aspirin EC 81 MG tablet, Take 81 mg by mouth daily., Disp: , Rfl:  .  atenolol (TENORMIN) 25 MG tablet, Take 25 mg by mouth 2 (two) times daily., Disp: , Rfl:  .  atorvastatin (LIPITOR) 20 MG tablet, Take 1 tablet (20 mg total) by mouth daily at 6 PM., Disp: 30 tablet, Rfl: 11 .  AZOPT 1 % ophthalmic suspension, Place 1 drop into both eyes 3 (three) times daily., Disp: , Rfl: 5 .  clopidogrel (PLAVIX) 75 MG tablet, Take 1 tablet (75 mg total) by mouth daily with breakfast., Disp: 30 tablet, Rfl: 11 .  enalapril (VASOTEC) 10 MG tablet, Take 10 mg by mouth 2 (two) times daily., Disp: , Rfl:  .  gemfibrozil (LOPID) 600 MG tablet, Take 600 mg by mouth daily., Disp: , Rfl:  .  mirtazapine (REMERON) 15 MG tablet, Take 7.5 mg by mouth at bedtime as needed (sleep). , Disp: , Rfl: 0 .  Multiple Vitamin (MULTIVITAMIN WITH MINERALS) TABS tablet, Take 1 tablet by mouth daily., Disp: , Rfl:  .  oxymetazoline (AFRIN) 0.05 % nasal spray, Place 1 spray into both nostrils at bedtime., Disp: , Rfl:  .  Saw Palmetto 450 MG CAPS, Take 450 mg by mouth daily., Disp: , Rfl:  .  sertraline (ZOLOFT) 100 MG tablet, Take 150 mg by mouth daily. Takes 1.5 tablet, Disp: , Rfl:  .  TRAVATAN Z 0.004 % SOLN ophthalmic solution, Place 1 drop into both eyes at bedtime., Disp: , Rfl: 2 .  triazolam (HALCION) 0.25 MG tablet, Take 0.5 mg by mouth at bedtime. Takes 2 tablets at bedtime, Disp: , Rfl:  .  triazolam (HALCION) 0.25 MG tablet, Take by mouth., Disp: , Rfl:   Past Medical History: Past Medical History:  Diagnosis Date   . Arthritis   . Cataract   . Glaucoma   . Hypertension   . Melanoma ([place])     Tobacco Use: History  Smoking Status  . Never Smoker  Smokeless Tobacco  . Never Used    Labs: Recent Review Flowsheet Data    There is no flowsheet data to display.       Exercise Target Goals: Date: 05/29/17  Exercise Program Goal: Individual exercise prescription set with THRR, safety & activity barriers. Participant demonstrates ability to understand and report RPE using BORG scale, to self-measure pulse accurately, and to acknowledge the importance of the exercise prescription.  Exercise Prescription Goal: Starting with aerobic activity 30 plus minutes a day, 3 days per week for initial exercise prescription. Provide home exercise prescription and guidelines that participant acknowledges understanding prior to discharge.  Activity Barriers & Risk Stratification:     Activity Barriers & Cardiac Risk Stratification - 05/29/17 1250      Activity Barriers & Cardiac Risk Stratification   Activity Barriers Deconditioning;Balance Concerns   Cardiac Risk Stratification High      6 Minute Walk:     6 Minute Walk    Row Name 05/29/17 1350         6 Minute Walk   Phase Initial  Distance 1460 feet     Walk Time 6 minutes     # of Rest Breaks 0     MPH 2.77     METS 3.78     RPE 8     VO2 Peak 13.24     Symptoms No     Resting HR 56 bpm     Resting BP 126/54     Max Ex. HR 108 bpm     Max Ex. BP 142/66     2 Minute Post BP 112/56        Oxygen Initial Assessment:     Oxygen Initial Assessment - 05/29/17 1142      Home Oxygen   Home Oxygen Device None   Sleep Oxygen Prescription None   Home Exercise Oxygen Prescription None   Home at Rest Exercise Oxygen Prescription None     Initial 6 min Walk   Oxygen Used None      Oxygen Re-Evaluation:   Oxygen Discharge (Final Oxygen Re-Evaluation):   Initial Exercise Prescription:     Initial Exercise  Prescription - 05/29/17 1300      Date of Initial Exercise RX and Referring Provider   Date 05/29/17   Referring Provider Bartholome Bill MD     Treadmill   MPH 2.7   Grade 1   Minutes 15   METs 3.44     Recumbant Elliptical   Level 2   RPM 50   Minutes 15   METs 3     T5 Nustep   Level 3   SPM 100   Minutes 15   METs 3     Prescription Details   Frequency (times per week) 2   Duration Progress to 45 minutes of aerobic exercise without signs/symptoms of physical distress     Intensity   THRR 40-80% of Max Heartrate 96-136   Ratings of Perceived Exertion 11-13   Perceived Dyspnea 0-4     Progression   Progression Continue to progress workloads to maintain intensity without signs/symptoms of physical distress.     Resistance Training   Training Prescription Yes   Weight 4 lbs   Reps 10-15      Perform Capillary Blood Glucose checks as needed.  Exercise Prescription Changes:      Exercise Prescription Changes    Row Name 05/29/17 1300             Response to Exercise   Blood Pressure (Admit) 126/64       Blood Pressure (Exercise) 146/66       Blood Pressure (Exit) 112/56       Heart Rate (Admit) 56 bpm       Heart Rate (Exercise) 108 bpm       Heart Rate (Exit) 57 bpm       Oxygen Saturation (Admit) 98 %       Oxygen Saturation (Exercise) 98 %       Rating of Perceived Exertion (Exercise) 8       Symptoms none       Comments walk test results          Exercise Comments:   Exercise Goals and Review:      Exercise Goals    Row Name 05/29/17 1354             Exercise Goals   Increase Physical Activity Yes       Intervention Provide advice, education, support and counseling about physical activity/exercise needs.;Develop an individualized exercise  prescription for aerobic and resistive training based on initial evaluation findings, risk stratification, comorbidities and participant's personal goals.       Expected Outcomes Achievement of  increased cardiorespiratory fitness and enhanced flexibility, muscular endurance and strength shown through measurements of functional capacity and personal statement of participant.       Increase Strength and Stamina Yes       Intervention Provide advice, education, support and counseling about physical activity/exercise needs.;Develop an individualized exercise prescription for aerobic and resistive training based on initial evaluation findings, risk stratification, comorbidities and participant's personal goals.       Expected Outcomes Achievement of increased cardiorespiratory fitness and enhanced flexibility, muscular endurance and strength shown through measurements of functional capacity and personal statement of participant.          Exercise Goals Re-Evaluation :   Discharge Exercise Prescription (Final Exercise Prescription Changes):     Exercise Prescription Changes - 05/29/17 1300      Response to Exercise   Blood Pressure (Admit) 126/64   Blood Pressure (Exercise) 146/66   Blood Pressure (Exit) 112/56   Heart Rate (Admit) 56 bpm   Heart Rate (Exercise) 108 bpm   Heart Rate (Exit) 57 bpm   Oxygen Saturation (Admit) 98 %   Oxygen Saturation (Exercise) 98 %   Rating of Perceived Exertion (Exercise) 8   Symptoms none   Comments walk test results      Nutrition:  Target Goals: Understanding of nutrition guidelines, daily intake of sodium 1500mg , cholesterol 200mg , calories 30% from fat and 7% or less from saturated fats, daily to have 5 or more servings of fruits and vegetables.  Biometrics:     Pre Biometrics - 05/29/17 1354      Pre Biometrics   Height 5' 7.3" (1.709 m)   Weight 165 lb 4.8 oz (75 kg)   Waist Circumference 36 inches   Hip Circumference 37 inches   Waist to Hip Ratio 0.97 %   BMI (Calculated) 25.7   Single Leg Stand 3.04 seconds       Nutrition Therapy Plan and Nutrition Goals:     Nutrition Therapy & Goals - 05/29/17 1250       Nutrition Therapy   Drug/Food Interactions Statins/Certain Fruits      Nutrition Discharge: Rate Your Plate Scores:     Nutrition Assessments - 05/29/17 1251      MEDFICTS Scores   Pre Score 45      Nutrition Goals Re-Evaluation:   Nutrition Goals Discharge (Final Nutrition Goals Re-Evaluation):   Psychosocial: Target Goals: Acknowledge presence or absence of significant depression and/or stress, maximize coping skills, provide positive support system. Participant is able to verbalize types and ability to use techniques and skills needed for reducing stress and depression.   Initial Review & Psychosocial Screening:     Initial Psych Review & Screening - 05/29/17 1253      Initial Review   Current issues with Current Stress Concerns      Quality of Life Scores:      Quality of Life - 05/29/17 1050      Quality of Life Scores   Health/Function Pre 25.8 %   Socioeconomic Pre 29.64 %   Psych/Spiritual Pre 25.57 %   Family Pre 25.83 %   GLOBAL Pre 26.59 %      PHQ-9: Recent Review Flowsheet Data    Depression screen [facility] 2/9 05/29/2017   Decreased Interest 0   Down, Depressed, Hopeless 0   PHQ -  2 Score 0   Altered sleeping 0   Tired, decreased energy 0   Change in appetite 0   Feeling bad or failure about yourself  0   Trouble concentrating 1   Moving slowly or fidgety/restless 0   Suicidal thoughts 0   PHQ-9 Score 1   Difficult doing work/chores Not difficult at all     Interpretation of Total Score  Total Score Depression Severity:  1-4 = Minimal depression, 5-9 = Mild depression, 10-14 = Moderate depression, 15-19 = Moderately severe depression, 20-27 = Severe depression   Psychosocial Evaluation and Intervention:   Psychosocial Re-Evaluation:   Psychosocial Discharge (Final Psychosocial Re-Evaluation):   Vocational Rehabilitation: Provide vocational rehab assistance to qualifying candidates.   Vocational Rehab Evaluation & Intervention:      Vocational Rehab - 05/29/17 1052      Initial Vocational Rehab Evaluation & Intervention   Assessment shows need for Vocational Rehabilitation No      Education: Education Goals: Education classes will be provided on a weekly basis, covering required topics. Participant will state understanding/return demonstration of topics presented.  Learning Barriers/Preferences:     Learning Barriers/Preferences - 05/29/17 1248      Learning Barriers/Preferences   Learning Barriers --  HIstory Glaucoma for 20 years. Very limited visiion in his right eye.      Education Topics: General Nutrition Guidelines/Fats and Fiber: -Group instruction provided by verbal, written material, models and posters to present the general guidelines for heart healthy nutrition. Gives an explanation and review of dietary fats and fiber.   Controlling Sodium/Reading Food Labels: -Group verbal and written material supporting the discussion of sodium use in heart healthy nutrition. Review and explanation with models, verbal and written materials for utilization of the food label.   Exercise Physiology & Risk Factors: - Group verbal and written instruction with models to review the exercise physiology of the cardiovascular system and associated critical values. Details cardiovascular disease risk factors and the goals associated with each risk factor.   Aerobic Exercise & Resistance Training: - Gives group verbal and written discussion on the health impact of inactivity. On the components of aerobic and resistive training programs and the benefits of this training and how to safely progress through these programs.   Flexibility, Balance, General Exercise Guidelines: - Provides group verbal and written instruction on the benefits of flexibility and balance training programs. Provides general exercise guidelines with specific guidelines to those with heart or lung disease. Demonstration and skill practice  provided.   Stress Management: - Provides group verbal and written instruction about the health risks of elevated stress, cause of high stress, and healthy ways to reduce stress.   Depression: - Provides group verbal and written instruction on the correlation between heart/lung disease and depressed mood, treatment options, and the stigmas associated with seeking treatment.   Anatomy & Physiology of the Heart: - Group verbal and written instruction and models provide basic cardiac anatomy and physiology, with the coronary electrical and arterial systems. Review of: AMI, Angina, Valve disease, Heart Failure, Cardiac Arrhythmia, Pacemakers, and the ICD.   Cardiac Procedures: - Group verbal and written instruction and models to describe the testing methods done to diagnose heart disease. Reviews the outcomes of the test results. Describes the treatment choices: Medical Management, Angioplasty, or Coronary Bypass Surgery.   Cardiac Medications: - Group verbal and written instruction to review commonly prescribed medications for heart disease. Reviews the medication, class of the drug, and side effects. Includes the steps  to properly store meds and maintain the prescription regimen.   Go Sex-Intimacy & Heart Disease, Get SMART - Goal Setting: - Group verbal and written instruction through game format to discuss heart disease and the return to sexual intimacy. Provides group verbal and written material to discuss and apply goal setting through the application of the S.M.A.R.T. Method.   Other Matters of the Heart: - Provides group verbal, written materials and models to describe Heart Failure, Angina, Valve Disease, and Diabetes in the realm of heart disease. Includes description of the disease process and treatment options available to the cardiac patient.   Exercise & Equipment Safety: - Individual verbal instruction and demonstration of equipment use and safety with use of the  equipment.   Cardiac Rehab from 05/29/2017 in [facility] Cardiac and Pulmonary Rehab  Date  05/29/17  Educator  C. EnterkinRN  Instruction Review Code  1- partially meets, needs review/practice      Infection Prevention: - Provides verbal and written material to individual with discussion of infection control including proper hand washing and proper equipment cleaning during exercise session.   Cardiac Rehab from 05/29/2017 in [facility] Cardiac [facility] Pul[facility] Rehab  Date  05/29/17  Educator  C. Poydras  Instruction Review Code  1- partially meets, needs review/practice      Falls Prevention: - Provides verbal and written material to individual with discussion of falls prevention and safety.   Cardiac Rehab from 05/29/2017 in [facility] Cardiac and Pulmonary Rehab  Date  05/29/17  Educator  C. EnterkinRN      Diabetes: - Individual verbal and written instruction to review signs/symptoms of diabetes, desired ranges of glucose level fasting, after meals and with exercise. Advice that pre and post exercise glucose checks will be done for 3 sessions at entry of program.    Knowledge Questionnaire Score:     Knowledge Questionnaire Score - 05/29/17 1052      Knowledge Questionnaire Score   Pre Score 25/28      Core Components/Risk Factors/Patient Goals at Admission:     Personal Goals and Risk Factors at Admission - 05/29/17 1053      Core Components/Risk Factors/Patient Goals on Admission    Weight Management Yes;Weight Loss   Intervention Weight Management: Develop a combined nutrition and exercise program designed to reach desired caloric intake, while maintaining appropriate intake of nutrient and fiber, sodium and fats, and appropriate energy expenditure required for the weight goal.;Weight Management: Provide education and appropriate resources to help participant work on and attain dietary goals.   Admit Weight 165 lb 4.8 oz (75 kg)   Goal Weight: Short Term 160 lb (72.6 kg)   Goal  Weight: Long Term 155 lb (70.3 kg)   Expected Outcomes Long Term: Adherence to nutrition and physical activity/exercise program aimed toward attainment of established weight goal;Short Term: Continue to assess and modify interventions until short term weight is achieved;Weight Loss: Understanding of general recommendations for a balanced deficit meal plan, which promotes 1-2 lb weight loss per week and includes a negative energy balance of [phone] kcal/d;Understanding recommendations for meals to include 15-35% energy as protein, 25-35% energy from fat, 35-60% energy from carbohydrates, less than 200mg  of dietary cholesterol, 20-35 gm of total fiber daily;Understanding of distribution of calorie intake throughout the day with the consumption of 4-5 meals/snacks   Hypertension Yes   Intervention Provide education on lifestyle modifcations including regular physical activity/exercise, weight management, moderate sodium restriction and increased consumption of fresh fruit, vegetables, and low fat dairy,  alcohol moderation, and smoking cessation.;Monitor prescription use compliance.   Expected Outcomes Short Term: Continued assessment and intervention until BP is < 140/76mm HG in hypertensive participants. < 130/24mm HG in hypertensive participants with diabetes, heart failure or chronic kidney disease.;Long Term: Maintenance of blood pressure at goal levels.   Lipids Yes   Intervention Provide education and support for participant on nutrition & aerobic/resistive exercise along with prescribed medications to achieve LDL 70mg , HDL >40mg .   Expected Outcomes Short Term: Participant states understanding of desired cholesterol values and is compliant with medications prescribed. Participant is following exercise prescription and nutrition guidelines.;Long Term: Cholesterol controlled with medications as prescribed, with individualized exercise RX and with personalized nutrition plan. Value goals: LDL < 70mg , HDL >  40 mg.   Stress Yes   Intervention Offer individual and/or small group education and counseling on adjustment to heart disease, stress management and health-related lifestyle change. Teach and support self-help strategies.;Refer participants experiencing significant psychosocial distress to appropriate mental health specialists for further evaluation and treatment. When possible, include family members and significant others in education/counseling sessions.   Expected Outcomes Short Term: Participant demonstrates changes in health-related behavior, relaxation and other stress management skills, ability to obtain effective social support, and compliance with psychotropic medications if prescribed.;Long Term: Emotional wellbeing is indicated by absence of clinically significant psychosocial distress or social isolation.      Core Components/Risk Factors/Patient Goals Review:    Core Components/Risk Factors/Patient Goals at Discharge (Final Review):    ITP Comments:     ITP Comments    Row Name 05/29/17 1141 05/29/17 1249         ITP Comments ITP Created during Medical Review after Cardiac Rehab consent was signed at 10:25am today. Diagnosis documented in [facility] 05/11/2017 documentation.  HIstory Glaucoma for 20 years. Very limited visiion in his right eye. Clester reports his vision is ok in his left eye.          Comments: Ready to start Cardiac Rehab.

## 2017-05-29 NOTE — Patient Instructions (Addendum)
Patient Instructions  Patient Details  Name: Tommy Golden MRN: [id number] Date of Birth: 1951/07/02 Referring Provider:  [place] Hampshire, MD  Below are the personal goals you chose as well as exercise and nutrition goals. Our goal is to help you keep on track towards obtaining and maintaining your goals. We will be discussing your progress on these goals with you throughout the program.  Initial Exercise Prescription:     Initial Exercise Prescription - 05/29/17 1300      Date of Initial Exercise RX and Referring Provider   Date 05/29/17   Referring Provider Bartholome Bill MD     Treadmill   MPH 2.7   Grade 1   Minutes 15   METs 3.44     Recumbant Elliptical   Level 2   RPM 50   Minutes 15   METs 3     T5 Nustep   Level 3   SPM 100   Minutes 15   METs 3     Prescription Details   Frequency (times per week) 3   Duration Progress to 45 minutes of aerobic exercise without signs/symptoms of physical distress     Intensity   THRR 40-80% of Max Heartrate 96-136   Ratings of Perceived Exertion 11-13   Perceived Dyspnea 0-4     Progression   Progression Continue to progress workloads to maintain intensity without signs/symptoms of physical distress.     Resistance Training   Training Prescription Yes   Weight 4 lbs   Reps 10-15      Exercise Goals: Frequency: Be able to perform aerobic exercise three times per week working toward 3-5 days per week.  Intensity: Work with a perceived exertion of 11 (fairly light) - 15 (hard) as tolerated. Follow your new exercise prescription and watch for changes in prescription as you progress with the program. Changes will be reviewed with you when they are made.  Duration: You should be able to do 30 minutes of continuous aerobic exercise in addition to a 5 minute warm-up and a 5 minute cool-down routine.  Nutrition Goals: Your personal nutrition goals will be established when you do your nutrition analysis with the  dietician.  The following are nutrition guidelines to follow: Cholesterol < 200mg /day Sodium < 1500mg /day Fiber: Men over 50 yrs - 30 grams per day  Personal Goals:     Personal Goals and Risk Factors at Admission - 05/29/17 1053      Core Components/Risk Factors/Patient Goals on Admission    Weight Management Yes;Weight Loss   Intervention Weight Management: Develop a combined nutrition and exercise program designed to reach desired caloric intake, while maintaining appropriate intake of nutrient and fiber, sodium and fats, and appropriate energy expenditure required for the weight goal.;Weight Management: Provide education and appropriate resources to help participant work on and attain dietary goals.   Admit Weight 165 lb 4.8 oz (75 kg)   Goal Weight: Short Term 160 lb (72.6 kg)   Goal Weight: Long Term 155 lb (70.3 kg)   Expected Outcomes Long Term: Adherence to nutrition and physical activity/exercise program aimed toward attainment of established weight goal;Short Term: Continue to assess and modify interventions until short term weight is achieved;Weight Loss: Understanding of general recommendations for a balanced deficit meal plan, which promotes 1-2 lb weight loss per week and includes a negative energy balance of [phone] kcal/d;Understanding recommendations for meals to include 15-35% energy as protein, 25-35% energy from fat, 35-60% energy from carbohydrates, less than 200mg  of  dietary cholesterol, 20-35 gm of total fiber daily;Understanding of distribution of calorie intake throughout the day with the consumption of 4-5 meals/snacks   Hypertension Yes   Intervention Provide education on lifestyle modifcations including regular physical activity/exercise, weight management, moderate sodium restriction and increased consumption of fresh fruit, vegetables, and low fat dairy, alcohol moderation, and smoking cessation.;Monitor prescription use compliance.   Expected Outcomes Short Term:  Continued assessment and intervention until BP is < 140/35mm HG in hypertensive participants. < 130/58mm HG in hypertensive participants with diabetes, heart failure or chronic kidney disease.;Long Term: Maintenance of blood pressure at goal levels.   Lipids Yes   Intervention Provide education and support for participant on nutrition & aerobic/resistive exercise along with prescribed medications to achieve LDL 70mg , HDL >40mg .   Expected Outcomes Short Term: Participant states understanding of desired cholesterol values and is compliant with medications prescribed. Participant is following exercise prescription and nutrition guidelines.;Long Term: Cholesterol controlled with medications as prescribed, with individualized exercise RX and with personalized nutrition plan. Value goals: LDL < 70mg , HDL > 40 mg.   Stress Yes   Intervention Offer individual and/or small group education and counseling on adjustment to heart disease, stress management and health-related lifestyle change. Teach and support self-help strategies.;Refer participants experiencing significant psychosocial distress to appropriate mental health specialists for further evaluation and treatment. When possible, include family members and significant others in education/counseling sessions.   Expected Outcomes Short Term: Participant demonstrates changes in health-related behavior, relaxation and other stress management skills, ability to obtain effective social support, and compliance with psychotropic medications if prescribed.;Long Term: Emotional wellbeing is indicated by absence of clinically significant psychosocial distress or social isolation.      Tobacco Use Initial Evaluation: History  Smoking Status  . Never Smoker  Smokeless Tobacco  . Never Used    Copy of goals given to participant.

## 2017-05-29 NOTE — Progress Notes (Signed)
Daily Session Note  Patient Details  Name: Calloway Andrus MRN: [id number] Date of Birth: [birth year] Referring Provider:  Ubaldo Glassing  Encounter Date: 05/29/2017  Check In:     Session Check In - 05/29/17 1134      Check-In   Location [facility]Card[facility] & Pul[facility] Re[facility]   Staff Present Alberteen Sam, MA, ACSM RCEP, Exercise Physiologist;Theodor Mustin, RN, BSN;Susanne Bice, RN, BSN, CCRP   Supervising physician immediately available to respond to emergencies See telemetry face sheet for immediately available ER MD   Medication changes reported     No   Fall or balance concerns reported    No   Warm-up and Cool-down Performed as group-led instruction   Resistance Training Performed Yes   VAD Patient? No     Pain Assessment   Currently in Pain? No/denies         History  Smoking Status  . Never Smoker  Smokeless Tobacco  . Never Used    Goals Met:  Proper associated with RPD/PD & O2 Sat Exercise tolerated well Personal goals reviewed No report of cardiac concerns or symptoms  Goals Unmet:  Not Applicable  Comments:     Dr. Emily Filbert is Medical Director for [place] and [name].

## 2017-05-29 NOTE — Progress Notes (Signed)
Daily Session Note  Patient Details  Name: Tommy Golden MRN: [id number] Date of Birth: 10-28-1951 Referring Provider:    Encounter Date: 05/29/2017  Check In:     Session Check In - 05/29/17 1134      Check-In   Location [facility]   Staff Present Alberteen Sam, MA, ACSM RCEP, Exercise Physiologist;Noelia Lenart, RN, BSN;Susanne Bice, RN, BSN, [employer] physician immediately available to respond to emergencies See telemetry face sheet for immediately available ER MD   Medication changes reported     No   Fall or balance concerns reported    No   Warm-up and Cool-down Performed as group-led instruction   Resistance Training Performed Yes   VAD Patient? No     Pain Assessment   Currently in Pain? No/denies         History  Smoking Status  . Never Smoker  Smokeless Tobacco  . Never Used    Goals Met:  Proper associated with RPD/PD & O2 Sat Exercise tolerated well Strength training completed today  Goals Unmet:  Not Applicable  Comments:     Dr. Emily Filbert is Medical Director for [place] and [name].

## 2017-06-01 DIAGNOSIS — Z955 Presence of coronary angioplasty implant and graft: Secondary | ICD-10-CM

## 2017-06-01 NOTE — Progress Notes (Signed)
Daily Session Note  Patient Details  Name: Rigby Leonhardt MRN: [id number] Date of Birth: 1951-05-11 Referring Provider:     Cardiac Rehab from 05/29/2017 in [facility] Cardiac and Pulmonary Rehab  Referring Provider  Bartholome Bill MD      Encounter Date: 06/01/2017  Check In:     Session Check In - 06/01/17 0841      Check-In   Location [facility]   Staff Present Alberteen Sam, MA, ACSM RCEP, Exercise Physiologist;Amanda Oletta Darter, BA, ACSM CEP, Exercise Physiologist;Meredith Sherryll Burger, RN BSN   Supervising physician immediately available to respond to emergencies See telemetry face sheet for immediately available ER MD   Medication changes reported     No   Fall or balance concerns reported    No   Warm-up and Cool-down Performed on first and last piece of equipment   Resistance Training Performed Yes   VAD Patient? No     Pain Assessment   Currently in Pain? No/denies         History  Smoking Status  . Never Smoker  Smokeless Tobacco  . Never Used    Goals Met:  Exercise tolerated well Personal goals reviewed No report of cardiac concerns or symptoms Strength training completed today  Goals Unmet:  Not Applicable  Comments: First full day of exercise!  Patient was oriented to gym and equipment including functions, settings, policies, and procedures.  Patient's individual exercise prescription and treatment plan were reviewed.  All starting workloads were established based on the results of the 6 minute walk test done at initial orientation visit.  The plan for exercise progression was also introduced and progression will be customized based on patient's performance and goals.    Dr. Emily Filbert is Medical Director for [place] and [name].

## 2017-06-06 DIAGNOSIS — Z955 Presence of coronary angioplasty implant and graft: Secondary | ICD-10-CM

## 2017-06-06 NOTE — Progress Notes (Signed)
Daily Session Note  Patient Details  Name: Nandan Willems MRN: [id number] Date of Birth: [birth year] Referring Provider:     Cardiac Rehab from 05/29/2017 in [facility] Cardiac and Pulmonary Rehab  Referring Provider  Bartholome Bill MD      Encounter Date: 06/06/2017  Check In:     Session Check In - 06/06/17 0848      Check-In   Location [facility]   Staff Present Gerlene Burdock, RN, Vickki Hearing, BA, ACSM CEP, Exercise Physiologist;Krista Frederico Hamman, RN BSN   Supervising physician immediately available to respond to emergencies See telemetry face sheet for immediately available ER MD   Medication changes reported     No   Fall or balance concerns reported    No   Warm-up and Cool-down Performed on first and last piece of equipment   Resistance Training Performed Yes   VAD Patient? No     Pain Assessment   Currently in Pain? No/denies         History  Smoking Status  . Never Smoker  Smokeless Tobacco  . Never Used    Goals Met:  Proper associated with RPD/PD & O2 Sat Independence with exercise equipment Exercise tolerated well Strength training completed today  Goals Unmet:  Not Applicable  Comments: Pt able to follow exercise prescription today without complaint.  Will continue to monitor for progression.    Dr. Emily Filbert is Medical Director for [place] and [name].

## 2017-06-08 ENCOUNTER — Encounter: Payer: Medicare Other | Attending: Cardiovascular Disease

## 2017-06-08 DIAGNOSIS — Z7902 Long term (current) use of antithrombotics/antiplatelets: Secondary | ICD-10-CM | POA: Insufficient documentation

## 2017-06-08 DIAGNOSIS — Z7982 Long term (current) use of aspirin: Secondary | ICD-10-CM | POA: Insufficient documentation

## 2017-06-08 DIAGNOSIS — I10 Essential (primary) hypertension: Secondary | ICD-10-CM | POA: Diagnosis not present

## 2017-06-08 DIAGNOSIS — Z955 Presence of coronary angioplasty implant and graft: Secondary | ICD-10-CM | POA: Insufficient documentation

## 2017-06-08 DIAGNOSIS — Z79899 Other long term (current) drug therapy: Secondary | ICD-10-CM | POA: Diagnosis not present

## 2017-06-08 NOTE — Progress Notes (Signed)
Daily Session Note  Patient Details  Name: Govanni Plemons MRN: [id number] Date of Birth: 03-06-1951 Referring Provider:     Cardiac Rehab from 05/29/2017 in [facility] Rehab  Referring Provider  Bartholome Bill MD      Encounter Date: 06/08/2017  Check In:     Session Check In - 06/08/17 0851      Check-In   Location [facility]   Staff Present Alberteen Sam, MA, ACSM RCEP, Exercise Physiologist;Crit Obremski Oletta Darter, BA, ACSM CEP, Exercise Physiologist;Meredith Sherryll Burger, RN BSN   Supervising physician immediately available to respond to emergencies See telemetry face sheet for immediately available ER MD   Medication changes reported     No   Fall or balance concerns reported    No   Warm-up and Cool-down Performed on first and last piece of equipment   Resistance Training Performed Yes   VAD Patient? No     Pain Assessment   Currently in Pain? No/denies         History  Smoking Status  . Never Smoker  Smokeless Tobacco  . Never Used    Goals Met:  Independence with exercise equipment Exercise tolerated well No report of cardiac concerns or symptoms Strength training completed today  Goals Unmet:  Not Applicable  Comments: Pt able to follow exercise prescription today without complaint.  Will continue to monitor for progression.    Dr. Emily Filbert is Medical Director for [place] and [name].

## 2017-06-13 ENCOUNTER — Encounter: Payer: Medicare Other | Admitting: *Deleted

## 2017-06-13 DIAGNOSIS — Z955 Presence of coronary angioplasty implant and graft: Secondary | ICD-10-CM

## 2017-06-13 NOTE — Progress Notes (Signed)
Daily Session Note  Patient Details  Name: Tommy Golden MRN: [id number] Date of Birth: 1951-05-22 Referring Provider:     Cardiac Rehab from 05/29/2017 in [facility] Cardiac and Pulmonary Rehab  Referring Provider  Bartholome Bill MD      Encounter Date: 06/13/2017  Check In:     Session Check In - 06/13/17 0824      Check-In   Location [facility]   Staff Present Heath Lark, RN, BSN, CCRP;Malyn Aytes Luan Pulling, MA, ACSM RCEP, Exercise Physiologist;Krista Frederico Hamman, RN BSN   Supervising physician immediately available to respond to emergencies See telemetry face sheet for immediately available ER MD   Medication changes reported     No   Fall or balance concerns reported    No   Warm-up and Cool-down Performed on first and last piece of equipment   Resistance Training Performed Yes   VAD Patient? No     Pain Assessment   Currently in Pain? No/denies   Multiple Pain Sites No         History  Smoking Status  . Never Smoker  Smokeless Tobacco  . Never Used    Goals Met:  Independence with exercise equipment Exercise tolerated well Personal goals reviewed No report of cardiac concerns or symptoms Strength training completed today  Goals Unmet:  Not Applicable  Comments: Pt able to follow exercise prescription today without complaint.  Will continue to monitor for progression.    Dr. Emily Filbert is Medical Director for [place] and [name].

## 2017-06-15 ENCOUNTER — Encounter: Payer: Medicare Other | Admitting: *Deleted

## 2017-06-15 DIAGNOSIS — Z955 Presence of coronary angioplasty implant and graft: Secondary | ICD-10-CM

## 2017-06-15 NOTE — Progress Notes (Signed)
Daily Session Note  Patient Details  Name: Tommaso Cavitt MRN: [id number] Date of Birth: 1951/10/20 Referring Provider:     Cardiac Rehab from 05/29/2017 in [facility] Cardiac and Pulmonary Rehab  Referring Provider  Bartholome Bill MD      Encounter Date: 06/15/2017  Check In:     Session Check In - 06/15/17 0823      Check-In   Location [facility]   Staff Present Alberteen Sam, MA, ACSM RCEP, Exercise Physiologist;Krista Frederico Hamman, RN BSN;Meredith Sherryll Burger, RN BSN   Supervising physician immediately available to respond to emergencies See telemetry face sheet for immediately available ER MD   Medication changes reported     No   Fall or balance concerns reported    No   Warm-up and Cool-down Performed on first and last piece of equipment   Resistance Training Performed Yes   VAD Patient? No     Pain Assessment   Currently in Pain? No/denies   Multiple Pain Sites No         History  Smoking Status  . Never Smoker  Smokeless Tobacco  . Never Used    Goals Met:  Independence with exercise equipment Exercise tolerated well No report of cardiac concerns or symptoms Strength training completed today  Goals Unmet:  Not Applicable  Comments: Pt able to follow exercise prescription today without complaint.  Will continue to monitor for progression.    Dr. Emily Filbert is Medical Director for [place] and [name].

## 2017-06-20 ENCOUNTER — Encounter: Payer: Medicare Other | Admitting: *Deleted

## 2017-06-20 DIAGNOSIS — Z955 Presence of coronary angioplasty implant and graft: Secondary | ICD-10-CM

## 2017-06-20 NOTE — Progress Notes (Signed)
Daily Session Note  Patient Details  Name: Tommy Golden MRN: [id number] Date of Birth: 06-22-1951 Referring Provider:     Cardiac Rehab from 05/29/2017 in [facility] Cardiac and Pulmonary Rehab  Referring Provider  Bartholome Bill MD      Encounter Date: 06/20/2017  Check In:     Session Check In - 06/20/17 0829      Check-In   Location [facility]   Staff Present Heath Lark, RN, BSN, CCRP;Hillary Schwegler Luan Pulling, MA, ACSM RCEP, Exercise Physiologist;Amanda Oletta Darter, BA, ACSM CEP, Exercise Physiologist   Supervising physician immediately available to respond to emergencies See telemetry face sheet for immediately available ER MD   Medication changes reported     No   Fall or balance concerns reported    No   Warm-up and Cool-down Performed on first and last piece of equipment   Resistance Training Performed Yes   VAD Patient? No     Pain Assessment   Currently in Pain? No/denies   Multiple Pain Sites No         History  Smoking Status  . Never Smoker  Smokeless Tobacco  . Never Used    Goals Met:  Independence with exercise equipment Exercise tolerated well Personal goals reviewed No report of cardiac concerns or symptoms Strength training completed today  Goals Unmet:  Not Applicable  Comments: Pt able to follow exercise prescription today without complaint.  Will continue to monitor for progression. Reviewed home exercise with pt today.  Pt plans to walk at home and stores for exercise.  Reviewed THR, pulse, RPE, sign and symptoms, NTG use, and when to call 911 or MD.  Also discussed weather considerations and indoor options.  Pt voiced understanding.    Dr. Emily Filbert is Medical Director for [place] and [name].

## 2017-06-21 ENCOUNTER — Encounter: Payer: Self-pay | Admitting: *Deleted

## 2017-06-21 DIAGNOSIS — Z955 Presence of coronary angioplasty implant and graft: Secondary | ICD-10-CM

## 2017-06-21 NOTE — Progress Notes (Signed)
Cardiac Individual Treatment Plan  Patient Details  Name: Tommy Golden MRN: [id number] Date of Birth: Mar 28, 1951 Referring Provider:     Cardiac Rehab from 05/29/2017 in [facility] Cardiac and Pulmonary Rehab  Referring Provider  Bartholome Bill MD      Initial Encounter Date:    Cardiac Rehab from 05/29/2017 in [facility] Cardiac and Pulmonary Rehab  Date  05/29/17  Referring Provider  Bartholome Bill MD      Visit Diagnosis: Status post coronary artery stent placement  Patient's Home Medications on Admission:  Current Outpatient Prescriptions:  .  ALPRAZolam (XANAX XR) 1 MG 24 hr tablet, Take 1 mg by mouth 2 (two) times daily., Disp: , Rfl:  .  aspirin EC 81 MG tablet, Take 81 mg by mouth daily., Disp: , Rfl:  .  atenolol (TENORMIN) 25 MG tablet, Take 25 mg by mouth 2 (two) times daily., Disp: , Rfl:  .  atorvastatin (LIPITOR) 20 MG tablet, Take 1 tablet (20 mg total) by mouth daily at 6 PM., Disp: 30 tablet, Rfl: 11 .  AZOPT 1 % ophthalmic suspension, Place 1 drop into both eyes 3 (three) times daily., Disp: , Rfl: 5 .  clopidogrel (PLAVIX) 75 MG tablet, Take 1 tablet (75 mg total) by mouth daily with breakfast., Disp: 30 tablet, Rfl: 11 .  enalapril (VASOTEC) 10 MG tablet, Take 10 mg by mouth 2 (two) times daily., Disp: , Rfl:  .  gemfibrozil (LOPID) 600 MG tablet, Take 600 mg by mouth daily., Disp: , Rfl:  .  mirtazapine (REMERON) 15 MG tablet, Take 7.5 mg by mouth at bedtime as needed (sleep). , Disp: , Rfl: 0 .  Multiple Vitamin (MULTIVITAMIN WITH MINERALS) TABS tablet, Take 1 tablet by mouth daily., Disp: , Rfl:  .  oxymetazoline (AFRIN) 0.05 % nasal spray, Place 1 spray into both nostrils at bedtime., Disp: , Rfl:  .  Saw Palmetto 450 MG CAPS, Take 450 mg by mouth daily., Disp: , Rfl:  .  sertraline (ZOLOFT) 100 MG tablet, Take 150 mg by mouth daily. Takes 1.5 tablet, Disp: , Rfl:  .  TRAVATAN Z 0.004 % SOLN ophthalmic solution, Place 1 drop into both eyes at bedtime., Disp: , Rfl: 2 .   triazolam (HALCION) 0.25 MG tablet, Take 0.5 mg by mouth at bedtime. Takes 2 tablets at bedtime, Disp: , Rfl:  .  triazolam (HALCION) 0.25 MG tablet, Take by mouth., Disp: , Rfl:   Past Medical History: Past Medical History:  Diagnosis Date  . Arthritis   . Cataract   . Glaucoma   . Hypertension   . Melanoma ([place])     Tobacco Use: History  Smoking Status  . Never Smoker  Smokeless Tobacco  . Never Used    Labs: Recent Review Flowsheet Data    There is no flowsheet data to display.       Exercise Target Goals:    Exercise Program Goal: Individual exercise prescription set with THRR, safety & activity barriers. Participant demonstrates ability to understand and report RPE using BORG scale, to self-measure pulse accurately, and to acknowledge the importance of the exercise prescription.  Exercise Prescription Goal: Starting with aerobic activity 30 plus minutes a day, 3 days per week for initial exercise prescription. Provide home exercise prescription and guidelines that participant acknowledges understanding prior to discharge.  Activity Barriers & Risk Stratification:     Activity Barriers & Cardiac Risk Stratification - 05/29/17 1250      Activity Barriers & Cardiac Risk Stratification  Activity Barriers Deconditioning;Balance Concerns   Cardiac Risk Stratification High      6 Minute Walk:     6 Minute Walk    Row Name 05/29/17 1350         6 Minute Walk   Phase Initial     Distance 1460 feet     Walk Time 6 minutes     # of Rest Breaks 0     MPH 2.77     METS 3.78     RPE 8     VO2 Peak 13.24     Symptoms No     Resting HR 56 bpm     Resting BP 126/54     Max Ex. HR 108 bpm     Max Ex. BP 142/66     2 Minute Post BP 112/56        Oxygen Initial Assessment:     Oxygen Initial Assessment - 05/29/17 1142      Home Oxygen   Home Oxygen Device None   Sleep Oxygen Prescription None   Home Exercise Oxygen Prescription None   Home at Rest  Exercise Oxygen Prescription None     Initial 6 min Walk   Oxygen Used None      Oxygen Re-Evaluation:   Oxygen Discharge (Final Oxygen Re-Evaluation):   Initial Exercise Prescription:     Initial Exercise Prescription - 05/29/17 1300      Date of Initial Exercise RX and Referring Provider   Date 05/29/17   Referring Provider Bartholome Bill MD     Treadmill   MPH 2.7   Grade 1   Minutes 15   METs 3.44     Recumbant Elliptical   Level 2   RPM 50   Minutes 15   METs 3     T5 Nustep   Level 3   SPM 100   Minutes 15   METs 3     Prescription Details   Frequency (times per week) 2   Duration Progress to 45 minutes of aerobic exercise without signs/symptoms of physical distress     Intensity   THRR 40-80% of Max Heartrate 96-136   Ratings of Perceived Exertion 11-13   Perceived Dyspnea 0-4     Progression   Progression Continue to progress workloads to maintain intensity without signs/symptoms of physical distress.     Resistance Training   Training Prescription Yes   Weight 4 lbs   Reps 10-15      Perform Capillary Blood Glucose checks as needed.  Exercise Prescription Changes:     Exercise Prescription Changes    Row Name 05/29/17 1300 06/16/17 0800 06/20/17 0900         Response to Exercise   Blood Pressure (Admit) 126/64 128/64  -     Blood Pressure (Exercise) 146/66 136/70  -     Blood Pressure (Exit) 112/56 124/64  -     Heart Rate (Admit) 56 bpm 103 bpm  -     Heart Rate (Exercise) 108 bpm 93 bpm  -     Heart Rate (Exit) 57 bpm 57 bpm  -     Oxygen Saturation (Admit) 98 %  -  -     Oxygen Saturation (Exercise) 98 %  -  -     Rating of Perceived Exertion (Exercise) 8 16  -     Symptoms none none  -     Comments walk test results  -  -  Duration  - Continue with 45 min of aerobic exercise without signs/symptoms of physical distress.  -     Intensity  - THRR unchanged  -       Progression   Progression  - Continue to progress  workloads to maintain intensity without signs/symptoms of physical distress.  -     Average METs  - 2.53  -       Resistance Training   Training Prescription  - Yes  -     Weight  - 4 lbs  -     Reps  - 10-15  -       Interval Training   Interval Training  - No  -       Treadmill   MPH  - 2.7  -     Grade  - 1  -     Minutes  - 15  -     METs  - 3.44  -       Recumbant Elliptical   Level  - 2  -     Minutes  - 15  -     METs  - 2  -       T5 Nustep   Level  - 3  -     Minutes  - 15  -     METs  - 2.1  -       Home Exercise Plan   Plans to continue exercise at  -  - Home (comment)  walking at home and store     Frequency  -  - Add 3 additional days to program exercise sessions.     Initial Home Exercises Provided  -  - 06/20/17        Exercise Comments:     Exercise Comments    Row Name 06/01/17 0945           Exercise Comments First full day of exercise!  Patient was oriented to gym and equipment including functions, settings, policies, and procedures.  Patient's individual exercise prescription and treatment plan were reviewed.  All starting workloads were established based on the results of the 6 minute walk test done at initial orientation visit.  The plan for exercise progression was also introduced and progression will be customized based on patient's performance and goals.          Exercise Goals and Review:     Exercise Goals    Row Name 05/29/17 1354             Exercise Goals   Increase Physical Activity Yes       Intervention Provide advice, education, support and counseling about physical activity/exercise needs.;Develop an individualized exercise prescription for aerobic and resistive training based on initial evaluation findings, risk stratification, comorbidities and participant's personal goals.       Expected Outcomes Achievement of increased cardiorespiratory fitness and enhanced flexibility, muscular endurance and strength shown through  measurements of functional capacity and personal statement of participant.       Increase Strength and Stamina Yes       Intervention Provide advice, education, support and counseling about physical activity/exercise needs.;Develop an individualized exercise prescription for aerobic and resistive training based on initial evaluation findings, risk stratification, comorbidities and participant's personal goals.       Expected Outcomes Achievement of increased cardiorespiratory fitness and enhanced flexibility, muscular endurance and strength shown through measurements of functional capacity and personal statement of participant.  Exercise Goals Re-Evaluation :     Exercise Goals Re-Evaluation    Row Name 06/16/17 0816 06/20/17 0938           Exercise Goal Re-Evaluation   Exercise Goals Review Increase Physical Activity;Increase Strenth and Stamina Increase Physical Activity;Increase Strenth and Stamina      Comments Ronalee Belts is off to a good start in rehab.  He has already moved up a few of his workloads.  He continues to work hard at RPE of 1-16.  We will continue to monitor his progression.   Reviewed home exercise with pt today.  Pt plans to walk at home and stores for exercise.  Reviewed THR, pulse, RPE, sign and symptoms, NTG use, and when to call 911 or MD.  Also discussed weather considerations and indoor options.  Pt voiced understanding.      Expected Outcomes Short: Review home exercise guidelines.  Long: Add in exercise at home routinely. Short: Add in more walking at home.  Long: Exercise on a regular basis.         Discharge Exercise Prescription (Final Exercise Prescription Changes):     Exercise Prescription Changes - 06/20/17 0900      Home Exercise Plan   Plans to continue exercise at Home (comment)  walking at home and store   Frequency Add 3 additional days to program exercise sessions.   Initial Home Exercises Provided 06/20/17      Nutrition:  Target  Goals: Understanding of nutrition guidelines, daily intake of sodium '1500mg'$ , cholesterol '200mg'$ , calories 30% from fat and 7% or less from saturated fats, daily to have 5 or more servings of fruits and vegetables.  Biometrics:     Pre Biometrics - 05/29/17 1354      Pre Biometrics   Height 5' 7.3" (1.709 m)   Weight 165 lb 4.8 oz (75 kg)   Waist Circumference 36 inches   Hip Circumference 37 inches   Waist to Hip Ratio 0.97 %   BMI (Calculated) 25.7   Single Leg Stand 3.04 seconds       Nutrition Therapy Plan and Nutrition Goals:     Nutrition Therapy & Goals - 05/29/17 1250      Nutrition Therapy   Drug/Food Interactions Statins/Certain Fruits      Nutrition Discharge: Rate Your Plate Scores:     Nutrition Assessments - 05/29/17 1251      MEDFICTS Scores   Pre Score 45      Nutrition Goals Re-Evaluation:     Nutrition Goals Re-Evaluation    Row Name 06/20/17 0942             Goals   Nutrition Goal weight loss       Comment has appt scheduled to meet with dietician on 8/30       Expected Outcome Short: Meet with dietician.  Long: Work on heart healthy plan          Nutrition Goals Discharge (Final Nutrition Goals Re-Evaluation):     Nutrition Goals Re-Evaluation - 06/20/17 0942      Goals   Nutrition Goal weight loss   Comment has appt scheduled to meet with dietician on 8/30   Expected Outcome Short: Meet with dietician.  Long: Work on heart healthy plan      Psychosocial: Target Goals: Acknowledge presence or absence of significant depression and/or stress, maximize coping skills, provide positive support system. Participant is able to verbalize types and ability to use techniques and skills needed for reducing  stress and depression.   Initial Review & Psychosocial Screening:     Initial Psych Review & Screening - 05/29/17 1253      Initial Review   Current issues with Current Stress Concerns      Quality of Life Scores:       Quality of Life - 05/29/17 1050      Quality of Life Scores   Health/Function Pre 25.8 %   Socioeconomic Pre 29.64 %   Psych/Spiritual Pre 25.57 %   Family Pre 25.83 %   GLOBAL Pre 26.59 %      PHQ-9: Recent Review Flowsheet Data    Depression screen [facility] 2/9 05/29/2017   Decreased Interest 0   Down, Depressed, Hopeless 0   PHQ - 2 Score 0   Altered sleeping 0   Tired, decreased energy 0   Change in appetite 0   Feeling bad or failure about yourself  0   Trouble concentrating 1   Moving slowly or fidgety/restless 0   Suicidal thoughts 0   PHQ-9 Score 1   Difficult doing work/chores Not difficult at all     Interpretation of Total Score  Total Score Depression Severity:  1-4 = Minimal depression, 5-9 = Mild depression, 10-14 = Moderate depression, 15-19 = Moderately severe depression, 20-27 = Severe depression   Psychosocial Evaluation and Intervention:     Psychosocial Evaluation - 06/08/17 0918      Psychosocial Evaluation & Interventions   Interventions Encouraged to exercise with the program and follow exercise prescription;Stress management education   Comments Counselor met with Mr. Nawabi today Zulauf) for initial psychosocial evaluation.  He is a 66 year old who had two stents inserted approximately one month ago.  Berlyn has a strong support system with siblings; cousins and aunts who live locally and he is actively involved in his local church.  Glenmore has multiple health issues mostly associated with his heart; but also he suffers with glaucoma in his left eye which creates a great deal of anxiety for him.  He sleeps well with medication nightly and reports his appetite is "too good" as he is scheduled to meet with the dietician to discuss making positive changes.  Woodard has a history of depression and anxiety since 2010 and is on medication for this.  He reports his mood as stable and mostly positive currently.  Yuto has minimal stress except his health  issues at this time.  He has goals to lose weight and increase his energy levels while in this program.  Staff will follow with Legrand Como throughout the course of this program.      Expected Outcomes Chioke will benefit from consistent exercise to achieve his stated goals.  Also he plans to meet with the dietician to address his weight loss goals.  The educational and psychoeducational components of this program will be helpful for Kosisochukwu in understanding and managing his diagnoses in more positive ways.    Continue Psychosocial Services  Follow up required by staff      Psychosocial Re-Evaluation:   Psychosocial Discharge (Final Psychosocial Re-Evaluation):   Vocational Rehabilitation: Provide vocational rehab assistance to qualifying candidates.   Vocational Rehab Evaluation & Intervention:     Vocational Rehab - 05/29/17 1052      Initial Vocational Rehab Evaluation & Intervention   Assessment shows need for Vocational Rehabilitation No      Education: Education Goals: Education classes will be provided on a weekly basis, covering required topics. Participant will state understanding/return  demonstration of topics presented.  Learning Barriers/Preferences:     Learning Barriers/Preferences - 05/29/17 1248      Learning Barriers/Preferences   Learning Barriers --  [facility] Glaucoma for 20 years. Very limited visiion in his right eye.      Education Topics: General Nutrition Guidelines/Fats and Fiber: -Group instruction provided by verbal, written material, models and posters to present the general guidelines for heart healthy nutrition. Gives an explanation and review of dietary fats and fiber.   Controlling Sodium/Reading Food Labels: -Group verbal and written material supporting the discussion of sodium use in heart healthy nutrition. Review and explanation with models, verbal and written materials for utilization of the food label.   Exercise Physiology & Risk  Factors: - Group verbal and written instruction with models to review the exercise physiology of the cardiovascular system and associated critical values. Details cardiovascular disease risk factors and the goals associated with each risk factor.   Aerobic Exercise & Resistance Training: - Gives group verbal and written discussion on the health impact of inactivity. On the components of aerobic and resistive training programs and the benefits of this training and how to safely progress through these programs.   Flexibility, Balance, General Exercise Guidelines: - Provides group verbal and written instruction on the benefits of flexibility and balance training programs. Provides general exercise guidelines with specific guidelines to those with heart or lung disease. Demonstration and skill practice provided.   Stress Management: - Provides group verbal and written instruction about the health risks of elevated stress, cause of high stress, and healthy ways to reduce stress.   Depression: - Provides group verbal and written instruction on the correlation between heart/lung disease and depressed mood, treatment options, and the stigmas associated with seeking treatment.   Anatomy & Physiology of the Heart: - Group verbal and written instruction and models provide basic cardiac anatomy and physiology, with the coronary electrical and arterial systems. Review of: AMI, Angina, Valve disease, Heart Failure, Cardiac Arrhythmia, Pacemakers, and the ICD.   Cardiac Rehab from 06/20/2017 in [facility] Cardiac and Pulmonary Rehab  Date  06/01/17  Educator  [facility]  Instruction Review Code  2- meets goals/outcomes      Cardiac Procedures: - Group verbal and written instruction and models to describe the testing methods done to diagnose heart disease. Reviews the outcomes of the test results. Describes the treatment choices: Medical Management, Angioplasty, or Coronary Bypass Surgery.   Cardiac Rehab from  06/20/2017 in [facility] Cardiac and Pulmonary Rehab  Date  06/06/17  Educator  CE  Instruction Review Code  2- meets goals/outcomes      Cardiac Medications: - Group verbal and written instruction to review commonly prescribed medications for heart disease. Reviews the medication, class of the drug, and side effects. Includes the steps to properly store meds and maintain the prescription regimen.   Cardiac Rehab from 06/20/2017 in [facility] Cardiac and Pulmonary Rehab  Date  06/13/17 [8/7 Part One 8/9 Part Two]  Educator  SB  Instruction Review Code  2- meets goals/outcomes      Go Sex-Intimacy & Heart Disease, Get SMART - Goal Setting: - Group verbal and written instruction through game format to discuss heart disease and the return to sexual intimacy. Provides group verbal and written material to discuss and apply goal setting through the application of the S.M.A.R.T. Method.   Cardiac Rehab from 06/20/2017 in [facility] Cardiac and Pulmonary Rehab  Date  06/06/17  Educator  CE  Instruction Review Code  2-  meets goals/outcomes      Other Matters of the Heart: - Provides group verbal, written materials and models to describe Heart Failure, Angina, Valve Disease, and Diabetes in the realm of heart disease. Includes description of the disease process and treatment options available to the cardiac patient.   Cardiac Rehab from 06/20/2017 in [facility] Cardiac and Pulmonary Rehab  Date  06/01/17  Educator  [facility]  Instruction Review Code  2- meets goals/outcomes      Exercise & Equipment Safety: - Individual verbal instruction and demonstration of equipment use and safety with use of the equipment.   Cardiac Rehab from 06/20/2017 in [facility] Cardiac and Pulmonary Rehab  Date  05/29/17  Educator  C. EnterkinRN  Instruction Review Code  1- partially meets, needs review/practice      Infection Prevention: - Provides verbal and written material to individual with discussion of infection control including proper  hand washing and proper equipment cleaning during exercise session.   Cardiac Rehab from 06/20/2017 in [facility] Cardiac and Pulmonary Rehab  Date  05/29/17  Educator  C. Chalmers  Instruction Review Code  1- partially meets, needs review/practice      Falls Prevention: - Provides verbal and written material to individual with discussion of falls prevention and safety.   Cardiac Rehab from 06/20/2017 in [facility] Cardiac and Pulmonary Rehab  Date  05/29/17  Educator  C. EnterkinRN      Diabetes: - Individual verbal and written instruction to review signs/symptoms of diabetes, desired ranges of glucose level fasting, after meals and with exercise. Advice that pre and post exercise glucose checks will be done for 3 sessions at entry of program.    Knowledge Questionnaire Score:     Knowledge Questionnaire Score - 05/29/17 1052      Knowledge Questionnaire Score   Pre Score 25/28      Core Components/Risk Factors/Patient Goals at Admission:     Personal Goals and Risk Factors at Admission - 05/29/17 1053      Core Components/Risk Factors/Patient Goals on Admission    Weight Management Yes;Weight Loss   Intervention Weight Management: Develop a combined nutrition and exercise program designed to reach desired caloric intake, while maintaining appropriate intake of nutrient and fiber, sodium and fats, and appropriate energy expenditure required for the weight goal.;Weight Management: Provide education and appropriate resources to help participant work on and attain dietary goals.   Admit Weight 165 lb 4.8 oz (75 kg)   Goal Weight: Short Term 160 lb (72.6 kg)   Goal Weight: Long Term 155 lb (70.3 kg)   Expected Outcomes Long Term: Adherence to nutrition and physical activity/exercise program aimed toward attainment of established weight goal;Short Term: Continue to assess and modify interventions until short term weight is achieved;Weight Loss: Understanding of general recommendations for a  balanced deficit meal plan, which promotes 1-2 lb weight loss per week and includes a negative energy balance of [id number] kcal/d;Understanding recommendations for meals to include 15-35% energy as protein, 25-35% energy from fat, 35-60% energy from carbohydrates, less than '200mg'$  of dietary cholesterol, 20-35 gm of total fiber daily;Understanding of distribution of calorie intake throughout the day with the consumption of 4-5 meals/snacks   Hypertension Yes   Intervention Provide education on lifestyle modifcations including regular physical activity/exercise, weight management, moderate sodium restriction and increased consumption of fresh fruit, vegetables, and low fat dairy, alcohol moderation, and smoking cessation.;Monitor prescription use compliance.   Expected Outcomes Short Term: Continued assessment and intervention until BP is < 140/58m  HG in hypertensive participants. < 130/50m HG in hypertensive participants with diabetes, heart failure or chronic kidney disease.;Long Term: Maintenance of blood pressure at goal levels.   Lipids Yes   Intervention Provide education and support for participant on nutrition & aerobic/resistive exercise along with prescribed medications to achieve LDL '70mg'$ , HDL >'40mg'$ .   Expected Outcomes Short Term: Participant states understanding of desired cholesterol values and is compliant with medications prescribed. Participant is following exercise prescription and nutrition guidelines.;Long Term: Cholesterol controlled with medications as prescribed, with individualized exercise RX and with personalized nutrition plan. Value goals: LDL < '70mg'$ , HDL > 40 mg.   Stress Yes   Intervention Offer individual and/or small group education and counseling on adjustment to heart disease, stress management and health-related lifestyle change. Teach and support self-help strategies.;Refer participants experiencing significant psychosocial distress to appropriate mental health specialists  for further evaluation and treatment. When possible, include family members and significant others in education/counseling sessions.   Expected Outcomes Short Term: Participant demonstrates changes in health-related behavior, relaxation and other stress management skills, ability to obtain effective social support, and compliance with psychotropic medications if prescribed.;Long Term: Emotional wellbeing is indicated by absence of clinically significant psychosocial distress or social isolation.      Core Components/Risk Factors/Patient Goals Review:      Goals and Risk Factor Review    Row Name 06/20/17 0941             Core Components/Risk Factors/Patient Goals Review   Personal Goals Review Weight Management/Obesity;Hypertension;Lipids       Review MRonalee Beltsis doing well in rehab.  He is discouraged that he is only down 1 lb so far.  He is going to add in more exercise at home and meet with the dietician to help work on weight loss.   He is doing well with his blood pressure and taking his medications.  He has not had any problems with his statins.       Expected Outcomes Short: Exercise more to lose weight and meet with dietician.  Long: Continue to work on risk factor modification.          Core Components/Risk Factors/Patient Goals at Discharge (Final Review):      Goals and Risk Factor Review - 06/20/17 0941      Core Components/Risk Factors/Patient Goals Review   Personal Goals Review Weight Management/Obesity;Hypertension;Lipids   Review MRonalee Beltsis doing well in rehab.  He is discouraged that he is only down 1 lb so far.  He is going to add in more exercise at home and meet with the dietician to help work on weight loss.   He is doing well with his blood pressure and taking his medications.  He has not had any problems with his statins.   Expected Outcomes Short: Exercise more to lose weight and meet with dietician.  Long: Continue to work on risk factor modification.      ITP  Comments:     ITP Comments    Row Name 05/29/17 1141 05/29/17 1249 06/21/17 0626       ITP Comments ITP Created during Medical Review after Cardiac Rehab consent was signed at 10:25am today. [facility]  HIstory Glaucoma for 20 years. Very limited visiion in his right eye. MGianluccareports his vision is ok in his left eye.  30 day review. Continue with ITP unless directed changes per Medical Director review         Comments:

## 2017-06-22 DIAGNOSIS — Z955 Presence of coronary angioplasty implant and graft: Secondary | ICD-10-CM | POA: Diagnosis not present

## 2017-06-22 NOTE — Progress Notes (Signed)
Daily Session Note  Patient Details  Name: Kahari Critzer MRN: [id number] Date of Birth: Aug 27, 1951 Referring Provider:     Cardiac Rehab from 05/29/2017 in [facility] Cardiac and Pulmonary Rehab  Referring Provider  Bartholome Bill MD      Encounter Date: 06/22/2017  Check In:     Session Check In - 06/22/17 0902      Check-In   Location [facility]-Cardiac & Pulmonary Rehab   Staff Present Alberteen Sam, MA, ACSM RCEP, Exercise Physiologist;Danyale Ridinger Oletta Darter, BA, ACSM CEP, Exercise Physiologist;Meredith Sherryll Burger, RN BSN   Supervising physician immediately available to respond to emergencies See telemetry face sheet for immediately available ER MD   Medication changes reported     No   Fall or balance concerns reported    No   Warm-up and Cool-down Performed on first and last piece of equipment   Resistance Training Performed Yes   VAD Patient? No     Pain Assessment   Currently in Pain? No/denies         History  Smoking Status  . Never Smoker  Smokeless Tobacco  . Never Used    Goals Met:  Independence with exercise equipment Exercise tolerated well No report of cardiac concerns or symptoms Strength training completed today  Goals Unmet:  Not Applicable  Comments: Pt able to follow exercise prescription today without complaint.  Will continue to monitor for progression.    Dr. Emily Filbert is Medical Director for [place] and [name].

## 2017-06-27 ENCOUNTER — Encounter: Payer: Medicare Other | Admitting: *Deleted

## 2017-06-27 DIAGNOSIS — Z955 Presence of coronary angioplasty implant and graft: Secondary | ICD-10-CM | POA: Diagnosis not present

## 2017-06-27 NOTE — Progress Notes (Signed)
Daily Session Note  Patient Details  Name: Tommy Golden MRN: [id number] Date of Birth: Jan 06, 1951 Referring Provider:     Cardiac Rehab from 05/29/2017 in [facility] Cardiac and Pulmonary Rehab  Referring Provider  Bartholome Bill MD      Encounter Date: 06/27/2017  Check In:     Session Check In - 06/27/17 0833      Check-In   Location [facility]-Cardiac & Pulmonary Rehab   Staff Present Heath Lark, RN, BSN, CCRP;Ameera Tigue Luan Pulling, MA, ACSM RCEP, Exercise Physiologist;Amanda Oletta Darter, BA, ACSM CEP, Exercise Physiologist   Supervising physician immediately available to respond to emergencies See telemetry face sheet for immediately available ER MD   Medication changes reported     No   Fall or balance concerns reported    No   Warm-up and Cool-down Performed on first and last piece of equipment   Resistance Training Performed Yes   VAD Patient? No     Pain Assessment   Currently in Pain? No/denies   Multiple Pain Sites No         History  Smoking Status  . Never Smoker  Smokeless Tobacco  . Never Used    Goals Met:  Independence with exercise equipment Exercise tolerated well No report of cardiac concerns or symptoms Strength training completed today  Goals Unmet:  Not Applicable  Comments: Pt able to follow exercise prescription today without complaint.  Will continue to monitor for progression.    Dr. Emily Filbert is Medical Director for [place] and [name].

## 2017-06-29 ENCOUNTER — Encounter: Payer: Medicare Other | Admitting: *Deleted

## 2017-06-29 DIAGNOSIS — Z955 Presence of coronary angioplasty implant and graft: Secondary | ICD-10-CM | POA: Diagnosis not present

## 2017-06-29 NOTE — Progress Notes (Signed)
Daily Session Note  Patient Details  Name: Tommy Golden MRN: [id number] Date of Birth: 01/10/1951 Referring Provider:     Cardiac Rehab from 05/29/2017 in [facility] Cardiac and Pulmonary Rehab  Referring Provider  Fath, Kenneth MD      Encounter Date: 06/29/2017  Check In:     Session Check In - 06/29/17 0933      Check-In   Location [facility]-Cardiac & Pulmonary Rehab   Staff Present Jessica Hawkins, MA, ACSM RCEP, Exercise Physiologist;Amanda Sommer, BA, ACSM CEP, Exercise Physiologist;Meredith Craven, RN BSN   Supervising physician immediately available to respond to emergencies See telemetry face sheet for immediately available ER MD   Medication changes reported     No   Fall or balance concerns reported    No   Warm-up and Cool-down Performed on first and last piece of equipment   Resistance Training Performed Yes   VAD Patient? No     Pain Assessment   Currently in Pain? No/denies   Multiple Pain Sites No         History  Smoking Status  . Never Smoker  Smokeless Tobacco  . Never Used    Goals Met:  Independence with exercise equipment Exercise tolerated well No report of cardiac concerns or symptoms Strength training completed today  Goals Unmet:  Not Applicable  Comments: Pt able to follow exercise prescription today without complaint.  Will continue to monitor for progression.    Dr. Mark Miller is Medical Director for [facility] and [name]. 

## 2017-07-04 ENCOUNTER — Encounter: Payer: Medicare Other | Admitting: *Deleted

## 2017-07-04 DIAGNOSIS — Z955 Presence of coronary angioplasty implant and graft: Secondary | ICD-10-CM

## 2017-07-04 NOTE — Progress Notes (Signed)
Daily Session Note  Patient Details  Name: Tommy Golden MRN: [id number] Date of Birth: Aug 29, 1951 Referring Provider:     Cardiac Rehab from 05/29/2017 in [facility] Cardiac and Pulmonary Rehab  Referring Provider  Bartholome Bill MD      Encounter Date: 07/04/2017  Check In:     Session Check In - 07/04/17 0847      Check-In   Location [facility]-Cardiac & Pulmonary Rehab   Staff Present Heath Lark, RN, BSN, CCRP;Jessica Luan Pulling, MA, ACSM RCEP, Exercise Physiologist;Amanda Oletta Darter, BA, ACSM CEP, Exercise Physiologist;Hassell Patras Flavia Shipper   Supervising physician immediately available to respond to emergencies See telemetry face sheet for immediately available ER MD   Medication changes reported     No   Fall or balance concerns reported    No   Warm-up and Cool-down Performed on first and last piece of equipment   Resistance Training Performed Yes   VAD Patient? No     Pain Assessment   Currently in Pain? No/denies   Multiple Pain Sites No         History  Smoking Status  . Never Smoker  Smokeless Tobacco  . Never Used    Goals Met:  Independence with exercise equipment Exercise tolerated well No report of cardiac concerns or symptoms Strength training completed today  Goals Unmet:  Not Applicable  Comments: Pt able to follow exercise prescription today without complaint.  Will continue to monitor for progression.   Dr. Emily Filbert is Medical Director for [place] and [name].

## 2017-07-06 DIAGNOSIS — Z955 Presence of coronary angioplasty implant and graft: Secondary | ICD-10-CM

## 2017-07-06 NOTE — Progress Notes (Signed)
Daily Session Note  Patient Details  Name: Tommy Golden MRN: [id number] Date of Birth: 05/08/1951 Referring Provider:     Cardiac Rehab from 05/29/2017 in [facility] Cardiac and Pulmonary Rehab  Referring Provider  Bartholome Bill MD      Encounter Date: 07/06/2017  Check In:     Session Check In - 07/06/17 1010      Check-In   Location [facility]-Cardiac & Pulmonary Rehab   Staff Present Nada Maclachlan, BA, ACSM CEP, Exercise Physiologist;Krista Frederico Hamman, RN BSN;Jessica Luan Pulling, MA, ACSM RCEP, Exercise Physiologist   Supervising physician immediately available to respond to emergencies See telemetry face sheet for immediately available ER MD   Medication changes reported     No   Fall or balance concerns reported    No   Warm-up and Cool-down Performed on first and last piece of equipment   Resistance Training Performed Yes   VAD Patient? No     Pain Assessment   Currently in Pain? No/denies         History  Smoking Status  . Never Smoker  Smokeless Tobacco  . Never Used    Goals Met:  Independence with exercise equipment Exercise tolerated well No report of cardiac concerns or symptoms Strength training completed today  Goals Unmet:  Not Applicable  Comments: Reviewed RPE scale, THR and program prescription with pt today.  Pt voiced understanding and was given a copy of goals to take home.   Short: Use RPE daily to regulate intensity.  Long: Follow program prescription in THR.    Dr. Emily Filbert is Medical Director for [name] and [name].

## 2017-07-11 ENCOUNTER — Telehealth: Payer: Self-pay | Admitting: *Deleted

## 2017-07-11 ENCOUNTER — Encounter: Payer: Self-pay | Admitting: *Deleted

## 2017-07-11 ENCOUNTER — Encounter: Payer: Medicare Other | Attending: Cardiovascular Disease

## 2017-07-11 DIAGNOSIS — Z7982 Long term (current) use of aspirin: Secondary | ICD-10-CM | POA: Insufficient documentation

## 2017-07-11 DIAGNOSIS — I10 Essential (primary) hypertension: Secondary | ICD-10-CM | POA: Insufficient documentation

## 2017-07-11 DIAGNOSIS — Z955 Presence of coronary angioplasty implant and graft: Secondary | ICD-10-CM | POA: Insufficient documentation

## 2017-07-11 DIAGNOSIS — Z7902 Long term (current) use of antithrombotics/antiplatelets: Secondary | ICD-10-CM | POA: Insufficient documentation

## 2017-07-11 DIAGNOSIS — Z79899 Other long term (current) drug therapy: Secondary | ICD-10-CM | POA: Insufficient documentation

## 2017-07-11 NOTE — Progress Notes (Signed)
Discharge Progress Report  Patient Details  Name: Tommy Golden MRN: [id number] Date of Birth: 1951-06-08 Referring Provider:     Cardiac Rehab from 05/29/2017 in [facility] Cardiac and Pulmonary Rehab  Referring Provider  Bartholome Bill MD       Number of Visits: 19/36   Reason for Discharge:  Patient reached a stable level of exercise. Patient independent in their exercise. Patient has met personal goals.  Smoking History:  History  Smoking Status  . Never Smoker  Smokeless Tobacco  . Never Used    Diagnosis:  Status post coronary artery stent placement  ADL UCSD:   Initial Exercise Prescription:     Initial Exercise Prescription - 05/29/17 1300      Date of Initial Exercise RX and Referring Provider   Date 05/29/17   Referring Provider Bartholome Bill MD     Treadmill   MPH 2.7   Grade 1   Minutes 15   METs 3.44     Recumbant Elliptical   Level 2   RPM 50   Minutes 15   METs 3     T5 Nustep   Level 3   SPM 100   Minutes 15   METs 3     Prescription Details   Frequency (times per week) 2   Duration Progress to 45 minutes of aerobic exercise without signs/symptoms of physical distress     Intensity   THRR 40-80% of Max Heartrate 96-136   Ratings of Perceived Exertion 11-13   Perceived Dyspnea 0-4     Progression   Progression Continue to progress workloads to maintain intensity without signs/symptoms of physical distress.     Resistance Training   Training Prescription Yes   Weight 4 lbs   Reps 10-15      Discharge Exercise Prescription (Final Exercise Prescription Changes):     Exercise Prescription Changes - 06/30/17 1400      Response to Exercise   Blood Pressure (Admit) 130/66   Blood Pressure (Exercise) 132/70   Blood Pressure (Exit) 130/68   Heart Rate (Admit) 65 bpm   Heart Rate (Exercise) 103 bpm   Heart Rate (Exit) 61 bpm   Rating of Perceived Exertion (Exercise) 15   Symptoms none   Duration Continue with 45 min of aerobic  exercise without signs/symptoms of physical distress.   Intensity THRR unchanged     Progression   Progression Continue to progress workloads to maintain intensity without signs/symptoms of physical distress.   Average METs 2.46     Resistance Training   Training Prescription Yes   Weight 4 lbs   Reps 10-15     Interval Training   Interval Training No     Treadmill   MPH 2.7   Grade 1   Minutes 15   METs 3.44     Recumbant Elliptical   Level 2   Minutes 15   METs 1.9     T5 Nustep   Level 3   Minutes 15   METs 2     Home Exercise Plan   Plans to continue exercise at Home (comment)  walking at home and store   Frequency Add 3 additional days to program exercise sessions.   Initial Home Exercises Provided 06/20/17      Functional Capacity:     6 Minute Walk    Row Name 05/29/17 1350         6 Minute Walk   Phase Initial     Distance 1460 feet  Walk Time 6 minutes     # of Rest Breaks 0     MPH 2.77     METS 3.78     RPE 8     VO2 Peak 13.24     Symptoms No     Resting HR 56 bpm     Resting BP 126/54     Max Ex. HR 108 bpm     Max Ex. BP 142/66     2 Minute Post BP 112/56        Psychological, QOL, Others - Outcomes: PHQ 2/9: Depression screen PHQ 2/9 05/29/2017  Decreased Interest 0  Down, Depressed, Hopeless 0  PHQ - 2 Score 0  Altered sleeping 0  Tired, decreased energy 0  Change in appetite 0  Feeling bad or failure about yourself  0  Trouble concentrating 1  Moving slowly or fidgety/restless 0  Suicidal thoughts 0  PHQ-9 Score 1  Difficult doing work/chores Not difficult at all    Quality of Life:     Quality of Life - 05/29/17 1050      Quality of Life Scores   Health/Function Pre 25.8 %   Socioeconomic Pre 29.64 %   Psych/Spiritual Pre 25.57 %   Family Pre 25.83 %   GLOBAL Pre 26.59 %      Personal Goals: Goals established at orientation with interventions provided to work toward goal.     Personal Goals and  Risk Factors at Admission - 05/29/17 1053      Core Components/Risk Factors/Patient Goals on Admission    Weight Management Yes;Weight Loss   Intervention Weight Management: Develop a combined nutrition and exercise program designed to reach desired caloric intake, while maintaining appropriate intake of nutrient and fiber, sodium and fats, and appropriate energy expenditure required for the weight goal.;Weight Management: Provide education and appropriate resources to help participant work on and attain dietary goals.   Admit Weight 165 lb 4.8 oz (75 kg)   Goal Weight: Short Term 160 lb (72.6 kg)   Goal Weight: Long Term 155 lb (70.3 kg)   Expected Outcomes Long Term: Adherence to nutrition and physical activity/exercise program aimed toward attainment of established weight goal;Short Term: Continue to assess and modify interventions until short term weight is achieved;Weight Loss: Understanding of general recommendations for a balanced deficit meal plan, which promotes 1-2 lb weight loss per week and includes a negative energy balance of [id number] kcal/d;Understanding recommendations for meals to include 15-35% energy as protein, 25-35% energy from fat, 35-60% energy from carbohydrates, less than 296m of dietary cholesterol, 20-35 gm of total fiber daily;Understanding of distribution of calorie intake throughout the day with the consumption of 4-5 meals/snacks   Hypertension Yes   Intervention Provide education on lifestyle modifcations including regular physical activity/exercise, weight management, moderate sodium restriction and increased consumption of fresh fruit, vegetables, and low fat dairy, alcohol moderation, and smoking cessation.;Monitor prescription use compliance.   Expected Outcomes Short Term: Continued assessment and intervention until BP is < 140/961mHG in hypertensive participants. < 130/8063mG in hypertensive participants with diabetes, heart failure or chronic kidney disease.;Long  Term: Maintenance of blood pressure at goal levels.   Lipids Yes   Intervention Provide education and support for participant on nutrition & aerobic/resistive exercise along with prescribed medications to achieve LDL <47m38mDL >40mg49mExpected Outcomes Short Term: Participant states understanding of desired cholesterol values and is compliant with medications prescribed. Participant is following exercise prescription and nutrition guidelines.;Long Term: Cholesterol  controlled with medications as prescribed, with individualized exercise RX and with personalized nutrition plan. Value goals: LDL < 40m, HDL > 40 mg.   Stress Yes   Intervention Offer individual and/or small group education and counseling on adjustment to heart disease, stress management and health-related lifestyle change. Teach and support self-help strategies.;Refer participants experiencing significant psychosocial distress to appropriate mental health specialists for further evaluation and treatment. When possible, include family members and significant others in education/counseling sessions.   Expected Outcomes Short Term: Participant demonstrates changes in health-related behavior, relaxation and other stress management skills, ability to obtain effective social support, and compliance with psychotropic medications if prescribed.;Long Term: Emotional wellbeing is indicated by absence of clinically significant psychosocial distress or social isolation.       Personal Goals Discharge:     Goals and Risk Factor Review    Row Name 06/20/17 0941 07/06/17 1012           Core Components/Risk Factors/Patient Goals Review   Personal Goals Review Weight Management/Obesity;Hypertension;Lipids Weight Management/Obesity;Hypertension      Review Tommy Beltsis doing well in rehab.  He is discouraged that he is only down 1 lb so far.  He is going to add in more exercise at home and meet with the dietician to help work on weight loss.   He is  doing well with his blood pressure and taking his medications.  He has not had any problems with his statins. Tommy Beltsmeets with the RD today.  He has lost 4 lb. by reducing portion sizes.  His BP has been good in classes.      Expected Outcomes Short: Exercise more to lose weight and meet with dietician.  Long: Continue to work on risk factor modification. Short - Tommy Beltswill learn more healthy changes from the RPerrywill implement them long term and maintain weight loss and good vital sigsn.         Exercise Goals and Review:     Exercise Goals    Row Name 05/29/17 1354             Exercise Goals   Increase Physical Activity Yes       Intervention Provide advice, education, support and counseling about physical activity/exercise needs.;Develop an individualized exercise prescription for aerobic and resistive training based on initial evaluation findings, risk stratification, comorbidities and participant's personal goals.       Expected Outcomes Achievement of increased cardiorespiratory fitness and enhanced flexibility, muscular endurance and strength shown through measurements of functional capacity and personal statement of participant.       Increase Strength and Stamina Yes       Intervention Provide advice, education, support and counseling about physical activity/exercise needs.;Develop an individualized exercise prescription for aerobic and resistive training based on initial evaluation findings, risk stratification, comorbidities and participant's personal goals.       Expected Outcomes Achievement of increased cardiorespiratory fitness and enhanced flexibility, muscular endurance and strength shown through measurements of functional capacity and personal statement of participant.          Nutrition & Weight - Outcomes:     Pre Biometrics - 05/29/17 1354      Pre Biometrics   Height 5' 7.3" (1.709 m)   Weight 165 lb 4.8 oz (75 kg)   Waist Circumference 36 inches    Hip Circumference 37 inches   Waist to Hip Ratio 0.97 %   BMI (Calculated) 25.7   Single Leg Stand 3.04 seconds  Nutrition:     Nutrition Therapy & Goals - 07/06/17 1241      Nutrition Therapy   Diet Instructed on a meal plan based on heart healthy diet principles   Drug/Food Interactions Statins/Certain Fruits   Protein (specify units) 8   Fiber 30 grams   Whole Grain Foods 3 servings   Saturated Fats 14 max. grams   Fruits and Vegetables 5 servings/day   Sodium 1500 grams  2000 mg acceptable     Personal Nutrition Goals   Nutrition Goal Read food labels for saturated fat, trans fat and sodium.   Personal Goal #2 Increase fruits/vegetables toward optimal goal of 8 servings per day.     Intervention Plan   Intervention Prescribe, educate and counsel regarding individualized specific dietary modifications aiming towards targeted core components such as weight, hypertension, lipid management, diabetes, heart failure and other comorbidities.;Nutrition handout(s) given to patient.   Expected Outcomes Short Term Goal: Understand basic principles of dietary content, such as calories, fat, sodium, cholesterol and nutrients.;Short Term Goal: A plan has been developed with personal nutrition goals set during dietitian appointment.;Long Term Goal: Adherence to prescribed nutrition plan.      Nutrition Discharge:     Nutrition Assessments - 05/29/17 1251      MEDFICTS Scores   Pre Score 45      Education Questionnaire Score:     Knowledge Questionnaire Score - 05/29/17 1052      Knowledge Questionnaire Score   Pre Score 25/28      Goals reviewed with patient; copy given to patient.

## 2017-07-11 NOTE — Progress Notes (Signed)
Cardiac Individual Treatment Plan  Patient Details  Name: Tommy Golden MRN: [id number] Date of Birth: Mar 28, 1951 Referring Provider:     Cardiac Rehab from 05/29/2017 in [facility] Cardiac and Pulmonary Rehab  Referring Provider  Bartholome Bill MD      Initial Encounter Date:    Cardiac Rehab from 05/29/2017 in [facility] Cardiac and Pulmonary Rehab  Date  05/29/17  Referring Provider  Bartholome Bill MD      Visit Diagnosis: Status post coronary artery stent placement  Patient's Home Medications on Admission:  Current Outpatient Prescriptions:  .  ALPRAZolam (XANAX XR) 1 MG 24 hr tablet, Take 1 mg by mouth 2 (two) times daily., Disp: , Rfl:  .  aspirin EC 81 MG tablet, Take 81 mg by mouth daily., Disp: , Rfl:  .  atenolol (TENORMIN) 25 MG tablet, Take 25 mg by mouth 2 (two) times daily., Disp: , Rfl:  .  atorvastatin (LIPITOR) 20 MG tablet, Take 1 tablet (20 mg total) by mouth daily at 6 PM., Disp: 30 tablet, Rfl: 11 .  AZOPT 1 % ophthalmic suspension, Place 1 drop into both eyes 3 (three) times daily., Disp: , Rfl: 5 .  clopidogrel (PLAVIX) 75 MG tablet, Take 1 tablet (75 mg total) by mouth daily with breakfast., Disp: 30 tablet, Rfl: 11 .  enalapril (VASOTEC) 10 MG tablet, Take 10 mg by mouth 2 (two) times daily., Disp: , Rfl:  .  gemfibrozil (LOPID) 600 MG tablet, Take 600 mg by mouth daily., Disp: , Rfl:  .  mirtazapine (REMERON) 15 MG tablet, Take 7.5 mg by mouth at bedtime as needed (sleep). , Disp: , Rfl: 0 .  Multiple Vitamin (MULTIVITAMIN WITH MINERALS) TABS tablet, Take 1 tablet by mouth daily., Disp: , Rfl:  .  oxymetazoline (AFRIN) 0.05 % nasal spray, Place 1 spray into both nostrils at bedtime., Disp: , Rfl:  .  Saw Palmetto 450 MG CAPS, Take 450 mg by mouth daily., Disp: , Rfl:  .  sertraline (ZOLOFT) 100 MG tablet, Take 150 mg by mouth daily. Takes 1.5 tablet, Disp: , Rfl:  .  TRAVATAN Z 0.004 % SOLN ophthalmic solution, Place 1 drop into both eyes at bedtime., Disp: , Rfl: 2 .   triazolam (HALCION) 0.25 MG tablet, Take 0.5 mg by mouth at bedtime. Takes 2 tablets at bedtime, Disp: , Rfl:  .  triazolam (HALCION) 0.25 MG tablet, Take by mouth., Disp: , Rfl:   Past Medical History: Past Medical History:  Diagnosis Date  . Arthritis   . Cataract   . Glaucoma   . Hypertension   . Melanoma ([place])     Tobacco Use: History  Smoking Status  . Never Smoker  Smokeless Tobacco  . Never Used    Labs: Recent Review Flowsheet Data    There is no flowsheet data to display.       Exercise Target Goals:    Exercise Program Goal: Individual exercise prescription set with THRR, safety & activity barriers. Participant demonstrates ability to understand and report RPE using BORG scale, to self-measure pulse accurately, and to acknowledge the importance of the exercise prescription.  Exercise Prescription Goal: Starting with aerobic activity 30 plus minutes a day, 3 days per week for initial exercise prescription. Provide home exercise prescription and guidelines that participant acknowledges understanding prior to discharge.  Activity Barriers & Risk Stratification:     Activity Barriers & Cardiac Risk Stratification - 05/29/17 1250      Activity Barriers & Cardiac Risk Stratification  Activity Barriers Deconditioning;Balance Concerns   Cardiac Risk Stratification High      6 Minute Walk:     6 Minute Walk    Row Name 05/29/17 1350         6 Minute Walk   Phase Initial     Distance 1460 feet     Walk Time 6 minutes     # of Rest Breaks 0     MPH 2.77     METS 3.78     RPE 8     VO2 Peak 13.24     Symptoms No     Resting HR 56 bpm     Resting BP 126/54     Max Ex. HR 108 bpm     Max Ex. BP 142/66     2 Minute Post BP 112/56        Oxygen Initial Assessment:     Oxygen Initial Assessment - 05/29/17 1142      Home Oxygen   Home Oxygen Device None   Sleep Oxygen Prescription None   Home Exercise Oxygen Prescription None   Home at Rest  Exercise Oxygen Prescription None     Initial 6 min Walk   Oxygen Used None      Oxygen Re-Evaluation:   Oxygen Discharge (Final Oxygen Re-Evaluation):   Initial Exercise Prescription:     Initial Exercise Prescription - 05/29/17 1300      Date of Initial Exercise RX and Referring Provider   Date 05/29/17   Referring Provider Bartholome Bill MD     Treadmill   MPH 2.7   Grade 1   Minutes 15   METs 3.44     Recumbant Elliptical   Level 2   RPM 50   Minutes 15   METs 3     T5 Nustep   Level 3   SPM 100   Minutes 15   METs 3     Prescription Details   Frequency (times per week) 2   Duration Progress to 45 minutes of aerobic exercise without signs/symptoms of physical distress     Intensity   THRR 40-80% of Max Heartrate 96-136   Ratings of Perceived Exertion 11-13   Perceived Dyspnea 0-4     Progression   Progression Continue to progress workloads to maintain intensity without signs/symptoms of physical distress.     Resistance Training   Training Prescription Yes   Weight 4 lbs   Reps 10-15      Perform Capillary Blood Glucose checks as needed.  Exercise Prescription Changes:     Exercise Prescription Changes    Row Name 05/29/17 1300 06/16/17 0800 06/20/17 0900 06/30/17 1400       Response to Exercise   Blood Pressure (Admit) 126/64 128/64  - 130/66    Blood Pressure (Exercise) 146/66 136/70  - 132/70    Blood Pressure (Exit) 112/56 124/64  - 130/68    Heart Rate (Admit) 56 bpm 103 bpm  - 65 bpm    Heart Rate (Exercise) 108 bpm 93 bpm  - 103 bpm    Heart Rate (Exit) 57 bpm 57 bpm  - 61 bpm    Oxygen Saturation (Admit) 98 %  -  -  -    Oxygen Saturation (Exercise) 98 %  -  -  -    Rating of Perceived Exertion (Exercise) 8 16  - 15    Symptoms none none  - none    Comments walk test results  -  -  -  Duration  - Continue with 45 min of aerobic exercise without signs/symptoms of physical distress.  - Continue with 45 min of aerobic exercise  without signs/symptoms of physical distress.    Intensity  - THRR unchanged  - THRR unchanged      Progression   Progression  - Continue to progress workloads to maintain intensity without signs/symptoms of physical distress.  - Continue to progress workloads to maintain intensity without signs/symptoms of physical distress.    Average METs  - 2.53  - 2.46      Resistance Training   Training Prescription  - Yes  - Yes    Weight  - 4 lbs  - 4 lbs    Reps  - 10-15  - 10-15      Interval Training   Interval Training  - No  - No      Treadmill   MPH  - 2.7  - 2.7    Grade  - 1  - 1    Minutes  - 15  - 15    METs  - 3.44  - 3.44      Recumbant Elliptical   Level  - 2  - 2    Minutes  - 15  - 15    METs  - 2  - 1.9      T5 Nustep   Level  - 3  - 3    Minutes  - 15  - 15    METs  - 2.1  - 2      Home Exercise Plan   Plans to continue exercise at  -  - Home (comment)  walking at home and store Home (comment)  walking at home and store    Frequency  -  - Add 3 additional days to program exercise sessions. Add 3 additional days to program exercise sessions.    Initial Home Exercises Provided  -  - 06/20/17 06/20/17       Exercise Comments:     Exercise Comments    Row Name 06/01/17 0945           Exercise Comments First full day of exercise!  Patient was oriented to gym and equipment including functions, settings, policies, and procedures.  Patient's individual exercise prescription and treatment plan were reviewed.  All starting workloads were established based on the results of the 6 minute walk test done at initial orientation visit.  The plan for exercise progression was also introduced and progression will be customized based on patient's performance and goals.          Exercise Goals and Review:     Exercise Goals    Row Name 05/29/17 1354             Exercise Goals   Increase Physical Activity Yes       Intervention Provide advice, education, support and  counseling about physical activity/exercise needs.;Develop an individualized exercise prescription for aerobic and resistive training based on initial evaluation findings, risk stratification, comorbidities and participant's personal goals.       Expected Outcomes Achievement of increased cardiorespiratory fitness and enhanced flexibility, muscular endurance and strength shown through measurements of functional capacity and personal statement of participant.       Increase Strength and Stamina Yes       Intervention Provide advice, education, support and counseling about physical activity/exercise needs.;Develop an individualized exercise prescription for aerobic and resistive training based on initial evaluation findings, risk stratification, comorbidities and  participant's personal goals.       Expected Outcomes Achievement of increased cardiorespiratory fitness and enhanced flexibility, muscular endurance and strength shown through measurements of functional capacity and personal statement of participant.          Exercise Goals Re-Evaluation :     Exercise Goals Re-Evaluation    Row Name 06/16/17 0816 06/20/17 6389 06/30/17 1408 07/06/17 1015       Exercise Goal Re-Evaluation   Exercise Goals Review Increase Physical Activity;Increase Strenth and Stamina Increase Physical Activity;Increase Strenth and Stamina Increase Physical Activity;Increase Strenth and Stamina Increase Strength and Stamina;Increase Physical Activity    Comments Tommy Golden is off to a good start in rehab.  He has already moved up a few of his workloads.  He continues to work hard at RPE of 1-16.  We will continue to monitor his progression.   Reviewed home exercise with pt today.  Pt plans to walk at home and stores for exercise.  Reviewed THR, pulse, RPE, sign and symptoms, NTG use, and when to call 911 or MD.  Also discussed weather considerations and indoor options.  Pt voiced understanding. Tommy Golden has been doing well in rehab.   He is up to 3.0 mph on the treadmill.  Everything is still around a 14-15 on RPE scale. We will continue to monitor his progression.  Tommy Golden states that he feels "back to normal" since his cardiac event.  He is walking his dog at home 15-20 minutes most days.    Expected Outcomes Short: Review home exercise guidelines.  Long: Add in exercise at home routinely. Short: Add in more walking at home.  Long: Exercise on a regular basis. Short: Try to increase more workloads.  Long: Make exercise more routine.  Short - Tommy Golden will complete HT Long - Tommy Golden will continue to exercise on his own       Discharge Exercise Prescription (Final Exercise Prescription Changes):     Exercise Prescription Changes - 06/30/17 1400      Response to Exercise   Blood Pressure (Admit) 130/66   Blood Pressure (Exercise) 132/70   Blood Pressure (Exit) 130/68   Heart Rate (Admit) 65 bpm   Heart Rate (Exercise) 103 bpm   Heart Rate (Exit) 61 bpm   Rating of Perceived Exertion (Exercise) 15   Symptoms none   Duration Continue with 45 min of aerobic exercise without signs/symptoms of physical distress.   Intensity THRR unchanged     Progression   Progression Continue to progress workloads to maintain intensity without signs/symptoms of physical distress.   Average METs 2.46     Resistance Training   Training Prescription Yes   Weight 4 lbs   Reps 10-15     Interval Training   Interval Training No     Treadmill   MPH 2.7   Grade 1   Minutes 15   METs 3.44     Recumbant Elliptical   Level 2   Minutes 15   METs 1.9     T5 Nustep   Level 3   Minutes 15   METs 2     Home Exercise Plan   Plans to continue exercise at Home (comment)  walking at home and store   Frequency Add 3 additional days to program exercise sessions.   Initial Home Exercises Provided 06/20/17      Nutrition:  Target Goals: Understanding of nutrition guidelines, daily intake of sodium <1562m, cholesterol <2079m calories 30% from  fat and 7% or less  from saturated fats, daily to have 5 or more servings of fruits and vegetables.  Biometrics:     Pre Biometrics - 05/29/17 1354      Pre Biometrics   Height 5' 7.3" (1.709 m)   Weight 165 lb 4.8 oz (75 kg)   Waist Circumference 36 inches   Hip Circumference 37 inches   Waist to Hip Ratio 0.97 %   BMI (Calculated) 25.7   Single Leg Stand 3.04 seconds       Nutrition Therapy Plan and Nutrition Goals:     Nutrition Therapy & Goals - 07/06/17 1241      Nutrition Therapy   Diet Instructed on a meal plan based on heart healthy diet principles   Drug/Food Interactions Statins/Certain Fruits   Protein (specify units) 8   Fiber 30 grams   Whole Grain Foods 3 servings   Saturated Fats 14 max. grams   Fruits and Vegetables 5 servings/day   Sodium 1500 grams  2000 mg acceptable     Personal Nutrition Goals   Nutrition Goal Read food labels for saturated fat, trans fat and sodium.   Personal Goal #2 Increase fruits/vegetables toward optimal goal of 8 servings per day.     Intervention Plan   Intervention Prescribe, educate and counsel regarding individualized specific dietary modifications aiming towards targeted core components such as weight, hypertension, lipid management, diabetes, heart failure and other comorbidities.;Nutrition handout(s) given to patient.   Expected Outcomes Short Term Goal: Understand basic principles of dietary content, such as calories, fat, sodium, cholesterol and nutrients.;Short Term Goal: A plan has been developed with personal nutrition goals set during dietitian appointment.;Long Term Goal: Adherence to prescribed nutrition plan.      Nutrition Discharge: Rate Your Plate Scores:     Nutrition Assessments - 05/29/17 1251      MEDFICTS Scores   Pre Score 45      Nutrition Goals Re-Evaluation:     Nutrition Goals Re-Evaluation    Row Name 06/20/17 0942             Goals   Nutrition Goal weight loss       Comment  has appt scheduled to meet with dietician on 8/30       Expected Outcome Short: Meet with dietician.  Long: Work on heart healthy plan          Nutrition Goals Discharge (Final Nutrition Goals Re-Evaluation):     Nutrition Goals Re-Evaluation - 06/20/17 0942      Goals   Nutrition Goal weight loss   Comment has appt scheduled to meet with dietician on 8/30   Expected Outcome Short: Meet with dietician.  Long: Work on heart healthy plan      Psychosocial: Target Goals: Acknowledge presence or absence of significant depression and/or stress, maximize coping skills, provide positive support system. Participant is able to verbalize types and ability to use techniques and skills needed for reducing stress and depression.   Initial Review & Psychosocial Screening:     Initial Psych Review & Screening - 05/29/17 1253      Initial Review   Current issues with Current Stress Concerns      Quality of Life Scores:      Quality of Life - 05/29/17 1050      Quality of Life Scores   Health/Function Pre 25.8 %   Socioeconomic Pre 29.64 %   Psych/Spiritual Pre 25.57 %   Family Pre 25.83 %   GLOBAL Pre 26.59 %  PHQ-9: Recent Review Flowsheet Data    Depression screen [facility] 2/9 05/29/2017   Decreased Interest 0   Down, Depressed, Hopeless 0   PHQ - 2 Score 0   Altered sleeping 0   Tired, decreased energy 0   Change in appetite 0   Feeling bad or failure about yourself  0   Trouble concentrating 1   Moving slowly or fidgety/restless 0   Suicidal thoughts 0   PHQ-9 Score 1   Difficult doing work/chores Not difficult at all     Interpretation of Total Score  Total Score Depression Severity:  1-4 = Minimal depression, 5-9 = Mild depression, 10-14 = Moderate depression, 15-19 = Moderately severe depression, 20-27 = Severe depression   Psychosocial Evaluation and Intervention:     Psychosocial Evaluation - 06/08/17 0918      Psychosocial Evaluation & Interventions    Interventions Encouraged to exercise with the program and follow exercise prescription;Stress management education   Comments Counselor met with Tommy Golden today Dipiero) for initial psychosocial evaluation.  He is a 66 year old who had two stents inserted approximately one month ago.  Tommy Golden has a strong support system with siblings; cousins and aunts who live locally and he is actively involved in his local church.  Tommy Golden has multiple health issues mostly associated with his heart; but also he suffers with glaucoma in his left eye which creates a great deal of anxiety for him.  He sleeps well with medication nightly and reports his appetite is "too good" as he is scheduled to meet with the dietician to discuss making positive changes.  Tommy Golden has a history of depression and anxiety since 2010 and is on medication for this.  He reports his mood as stable and mostly positive currently.  Tommy Golden has minimal stress except his health issues at this time.  He has goals to lose weight and increase his energy levels while in this program.  Staff will follow with Tommy Golden throughout the course of this program.      Expected Outcomes Ronon will benefit from consistent exercise to achieve his stated goals.  Also he plans to meet with the dietician to address his weight loss goals.  The educational and psychoeducational components of this program will be helpful for Tommy Golden in understanding and managing his diagnoses in more positive ways.    Continue Psychosocial Services  Follow up required by staff      Psychosocial Re-Evaluation:   Psychosocial Discharge (Final Psychosocial Re-Evaluation):   Vocational Rehabilitation: Provide vocational rehab assistance to qualifying candidates.   Vocational Rehab Evaluation & Intervention:     Vocational Rehab - 05/29/17 1052      Initial Vocational Rehab Evaluation & Intervention   Assessment shows need for Vocational Rehabilitation No       Education: Education Goals: Education classes will be provided on a variety of topics geared toward better understanding of heart health and risk factor modification. Participant will state understanding/return demonstration of topics presented as noted by education test scores.  Learning Barriers/Preferences:     Learning Barriers/Preferences - 05/29/17 1248      Learning Barriers/Preferences   Learning Barriers --  [facility] Glaucoma for 20 years. Very limited visiion in his right eye.      Education Topics: General Nutrition Guidelines/Fats and Fiber: -Group instruction provided by verbal, written material, models and posters to present the general guidelines for heart healthy nutrition. Gives an explanation and review of dietary fats and fiber.   Cardiac Rehab  from 07/04/2017 in [facility] Cardiac and Pulmonary Rehab  Date  06/27/17  Educator  CR  Instruction Review Code  1- Verbalizes Understanding      Controlling Sodium/Reading Food Labels: -Group verbal and written material supporting the discussion of sodium use in heart healthy nutrition. Review and explanation with models, verbal and written materials for utilization of the food label.   Cardiac Rehab from 07/04/2017 in [facility] Cardiac and Pulmonary Rehab  Date  07/04/17  Educator  PI  Instruction Review Code  1- Verbalizes Understanding      Exercise Physiology & Risk Factors: - Group verbal and written instruction with models to review the exercise physiology of the cardiovascular system and associated critical values. Details cardiovascular disease risk factors and the goals associated with each risk factor.   Aerobic Exercise & Resistance Training: - Gives group verbal and written discussion on the health impact of inactivity. On the components of aerobic and resistive training programs and the benefits of this training and how to safely progress through these programs.   Flexibility, Balance, General Exercise  Guidelines: - Provides group verbal and written instruction on the benefits of flexibility and balance training programs. Provides general exercise guidelines with specific guidelines to those with heart or lung disease. Demonstration and skill practice provided.   Stress Management: - Provides group verbal and written instruction about the health risks of elevated stress, cause of high stress, and healthy ways to reduce stress.   Depression: - Provides group verbal and written instruction on the correlation between heart/lung disease and depressed mood, treatment options, and the stigmas associated with seeking treatment.   Cardiac Rehab from 07/04/2017 in [facility] Cardiac and Pulmonary Rehab  Date  06/22/17  Educator  [facility]  Instruction Review Code (retired)  2- meets Designer, fashion/clothing & Physiology of the Heart: - Group verbal and written instruction and models provide basic cardiac anatomy and physiology, with the coronary electrical and arterial systems. Review of: AMI, Angina, Valve disease, Heart Failure, Cardiac Arrhythmia, Pacemakers, and the ICD.   Cardiac Rehab from 07/04/2017 in [facility] Cardiac and Pulmonary Rehab  Date  06/01/17  Educator  [facility]      Cardiac Procedures: - Group verbal and written instruction to review commonly prescribed medications for heart disease. Reviews the medication, class of the drug, and side effects. Includes the steps to properly store meds and maintain the prescription regimen. (beta blockers and nitrates)   Cardiac Rehab from 07/04/2017 in [facility] Cardiac and Pulmonary Rehab  Date  06/06/17  Educator  CE      Cardiac Medications I: - Group verbal and written instruction to review commonly prescribed medications for heart disease. Reviews the medication, class of the drug, and side effects. Includes the steps to properly store meds and maintain the prescription regimen.   Cardiac Rehab from 07/04/2017 in [facility] Cardiac and Pulmonary Rehab  Date   06/13/17 [8/7 Part One 8/9 Part Two]  Educator  SB      Cardiac Medications II: -Group verbal and written instruction to review commonly prescribed medications for heart disease. Reviews the medication, class of the drug, and side effects. (all other drug classes)    Go Sex-Intimacy & Heart Disease, Get SMART - Goal Setting: - Group verbal and written instruction through game format to discuss heart disease and the return to sexual intimacy. Provides group verbal and written material to discuss and apply goal setting through the application of the S.M.A.R.T. Method.   Cardiac Rehab  from 07/04/2017 in [facility] Cardiac and Pulmonary Rehab  Date  06/06/17  Educator  CE      Other Matters of the Heart: - Provides group verbal, written materials and models to describe Heart Failure, Angina, Valve Disease, Peripheral Artery Disease, and Diabetes in the realm of heart disease. Includes description of the disease process and treatment options available to the cardiac patient.   Cardiac Rehab from 07/04/2017 in [facility] Cardiac and Pulmonary Rehab  Date  06/01/17  Educator  [facility]      Exercise & Equipment Safety: - Individual verbal instruction and demonstration of equipment use and safety with use of the equipment.   Cardiac Rehab from 07/04/2017 in [facility] Cardiac and Pulmonary Rehab  Date  05/29/17  Educator  C. EnterkinRN      Infection Prevention: - Provides verbal and written material to individual with discussion of infection control including proper hand washing and proper equipment cleaning during exercise session.   Cardiac Rehab from 07/04/2017 in [facility] Cardiac and Pulmonary Rehab  Date  05/29/17  Educator  C. EnterkinRN      Falls Prevention: - Provides verbal and written material to individual with discussion of falls prevention and safety.   Cardiac Rehab from 07/04/2017 in [facility] Cardiac and Pulmonary Rehab  Date  05/29/17  Educator  C. [place]  Instruction Review Code (retired)  2-  meets goals/outcomes      Diabetes: - Individual verbal and written instruction to review signs/symptoms of diabetes, desired ranges of glucose level fasting, after meals and with exercise. Acknowledge that pre and post exercise glucose checks will be done for 3 sessions at entry of program.   Other: -Provides group and verbal instruction on various topics (see comments)    Knowledge Questionnaire Score:     Knowledge Questionnaire Score - 05/29/17 1052      Knowledge Questionnaire Score   Pre Score 25/28      Core Components/Risk Factors/Patient Goals at Admission:     Personal Goals and Risk Factors at Admission - 05/29/17 1053      Core Components/Risk Factors/Patient Goals on Admission    Weight Management Yes;Weight Loss   Intervention Weight Management: Develop a combined nutrition and exercise program designed to reach desired caloric intake, while maintaining appropriate intake of nutrient and fiber, sodium and fats, and appropriate energy expenditure required for the weight goal.;Weight Management: Provide education and appropriate resources to help participant work on and attain dietary goals.   Admit Weight 165 lb 4.8 oz (75 kg)   Goal Weight: Short Term 160 lb (72.6 kg)   Goal Weight: Long Term 155 lb (70.3 kg)   Expected Outcomes Long Term: Adherence to nutrition and physical activity/exercise program aimed toward attainment of established weight goal;Short Term: Continue to assess and modify interventions until short term weight is achieved;Weight Loss: Understanding of general recommendations for a balanced deficit meal plan, which promotes 1-2 lb weight loss per week and includes a negative energy balance of [phone] kcal/d;Understanding recommendations for meals to include 15-35% energy as protein, 25-35% energy from fat, 35-60% energy from carbohydrates, less than 280m of dietary cholesterol, 20-35 gm of total fiber daily;Understanding of distribution of calorie  intake throughout the day with the consumption of 4-5 meals/snacks   Hypertension Yes   Intervention Provide education on lifestyle modifcations including regular physical activity/exercise, weight management, moderate sodium restriction and increased consumption of fresh fruit, vegetables, and low fat dairy, alcohol moderation, and smoking cessation.;Monitor prescription use compliance.   Expected Outcomes  Short Term: Continued assessment and intervention until BP is < 140/94m HG in hypertensive participants. < 130/885mHG in hypertensive participants with diabetes, heart failure or chronic kidney disease.;Long Term: Maintenance of blood pressure at goal levels.   Lipids Yes   Intervention Provide education and support for participant on nutrition & aerobic/resistive exercise along with prescribed medications to achieve LDL <7051mHDL >51m6m Expected Outcomes Short Term: Participant states understanding of desired cholesterol values and is compliant with medications prescribed. Participant is following exercise prescription and nutrition guidelines.;Long Term: Cholesterol controlled with medications as prescribed, with individualized exercise RX and with personalized nutrition plan. Value goals: LDL < 70mg20mL > 40 mg.   Stress Yes   Intervention Offer individual and/or small group education and counseling on adjustment to heart disease, stress management and health-related lifestyle change. Teach and support self-help strategies.;Refer participants experiencing significant psychosocial distress to appropriate mental health specialists for further evaluation and treatment. When possible, include family members and significant others in education/counseling sessions.   Expected Outcomes Short Term: Participant demonstrates changes in health-related behavior, relaxation and other stress management skills, ability to obtain effective social support, and compliance with psychotropic medications if  prescribed.;Long Term: Emotional wellbeing is indicated by absence of clinically significant psychosocial distress or social isolation.      Core Components/Risk Factors/Patient Goals Review:      Goals and Risk Factor Review    Row Name 06/20/17 0941 07/06/17 1012           Core Components/Risk Factors/Patient Goals Review   Personal Goals Review Weight Management/Obesity;Hypertension;Lipids Weight Management/Obesity;Hypertension      Review Mike Tommy Beltsoing well in rehab.  He is discouraged that he is only down 1 lb so far.  He is going to add in more exercise at home and meet with the dietician to help work on weight loss.   He is doing well with his blood pressure and taking his medications.  He has not had any problems with his statins. Mike Tommy Beltss with the RD today.  He has lost 4 lb. by reducing portion sizes.  His BP has been good in classes.      Expected Outcomes Short: Exercise more to lose weight and meet with dietician.  Long: Continue to work on risk factor modification. Short - Mike Tommy Golden learn more healthy changes from the RD  [place] implement them long term and maintain weight loss and good vital sigsn.         Core Components/Risk Factors/Patient Goals at Discharge (Final Review):      Goals and Risk Factor Review - 07/06/17 1012      Core Components/Risk Factors/Patient Goals Review   Personal Goals Review Weight Management/Obesity;Hypertension   Review Mike Tommy Beltss with the RD today.  He has lost 4 lb. by reducing portion sizes.  His BP has been good in classes.   Expected Outcomes Short - Mike Tommy Golden learn more healthy changes from the RD  [place] implement them long term and maintain weight loss and good vital sigsn.      ITP Comments:     ITP Comments    Row Name 05/29/17 1141 05/29/17 1249 06/21/17 0626 07/11/17 0954     ITP Comments ITP Created during Medical Review after Cardiac Rehab consent was signed at 10:25am today. Diagnosis documented  in [facility] documentation.  [facility]story Glaucoma for 20 years. Very limited visiion in his right eye. MichaDrakerts his vision is ok  in his left eye.  30 day review. Continue with ITP unless directed changes per Medical Director review  Tommy Golden called and left a message yesterday that he wishes to discharge from the program.  He felt that he had gotten what he could out of the program and will continue to exercise on his own at home.       Comments: Dicharge ITP

## 2017-07-11 NOTE — Telephone Encounter (Signed)
Tommy Golden called and left a message yesterday that he wishes to discharge from the program.  He felt that he had gotten what he could out of the program and will continue to exercise on his own at home.

## 2019-08-02 ENCOUNTER — Other Ambulatory Visit
Admission: RE | Admit: 2019-08-02 | Discharge: 2019-08-02 | Disposition: A | Discharge status: Home / Self Care | Payer: Medicare Other | Source: Ambulatory Visit | Attending: Internal Medicine | Admitting: Internal Medicine

## 2019-08-02 DIAGNOSIS — Z20828 Contact with and (suspected) exposure to other viral communicable diseases: Secondary | ICD-10-CM | POA: Insufficient documentation

## 2019-08-02 DIAGNOSIS — Z01812 Encounter for preprocedural laboratory examination: Secondary | ICD-10-CM | POA: Insufficient documentation

## 2019-08-03 LAB — SARS CORONAVIRUS 2 (TAT 6-24 HRS): SARS Coronavirus 2: NEGATIVE

## 2019-08-06 ENCOUNTER — Other Ambulatory Visit: Payer: Self-pay

## 2019-08-06 ENCOUNTER — Encounter
Admission: RE | Disposition: A | Discharge status: Home / Self Care | Payer: Self-pay | Source: Home / Self Care | Attending: Internal Medicine

## 2019-08-06 ENCOUNTER — Ambulatory Visit: Payer: Medicare Other | Admitting: Anesthesiology

## 2019-08-06 ENCOUNTER — Ambulatory Visit
Admission: RE | Admit: 2019-08-06 | Discharge: 2019-08-06 | Disposition: A | Discharge status: Home / Self Care | Payer: Medicare Other | Attending: Internal Medicine | Admitting: Internal Medicine

## 2019-08-06 ENCOUNTER — Encounter: Payer: Self-pay | Admitting: Anesthesiology

## 2019-08-06 DIAGNOSIS — Z8371 Family history of colonic polyps: Secondary | ICD-10-CM | POA: Insufficient documentation

## 2019-08-06 DIAGNOSIS — Z7982 Long term (current) use of aspirin: Secondary | ICD-10-CM | POA: Insufficient documentation

## 2019-08-06 DIAGNOSIS — D123 Benign neoplasm of transverse colon: Secondary | ICD-10-CM | POA: Diagnosis not present

## 2019-08-06 DIAGNOSIS — M1990 Unspecified osteoarthritis, unspecified site: Secondary | ICD-10-CM | POA: Diagnosis not present

## 2019-08-06 DIAGNOSIS — Z85820 Personal history of malignant melanoma of skin: Secondary | ICD-10-CM | POA: Diagnosis not present

## 2019-08-06 DIAGNOSIS — H409 Unspecified glaucoma: Secondary | ICD-10-CM | POA: Insufficient documentation

## 2019-08-06 DIAGNOSIS — I10 Essential (primary) hypertension: Secondary | ICD-10-CM | POA: Diagnosis not present

## 2019-08-06 DIAGNOSIS — Z79899 Other long term (current) drug therapy: Secondary | ICD-10-CM | POA: Insufficient documentation

## 2019-08-06 DIAGNOSIS — Z1211 Encounter for screening for malignant neoplasm of colon: Secondary | ICD-10-CM | POA: Diagnosis present

## 2019-08-06 DIAGNOSIS — Z880 Allergy status to penicillin: Secondary | ICD-10-CM | POA: Insufficient documentation

## 2019-08-06 DIAGNOSIS — K640 First degree hemorrhoids: Secondary | ICD-10-CM | POA: Diagnosis not present

## 2019-08-06 DIAGNOSIS — Z85828 Personal history of other malignant neoplasm of skin: Secondary | ICD-10-CM | POA: Insufficient documentation

## 2019-08-06 DIAGNOSIS — Z800 Family history of malignant neoplasm of digestive organs: Secondary | ICD-10-CM | POA: Insufficient documentation

## 2019-08-06 DIAGNOSIS — Z888 Allergy status to other drugs, medicaments and biological substances status: Secondary | ICD-10-CM | POA: Diagnosis not present

## 2019-08-06 HISTORY — PX: COLONOSCOPY WITH PROPOFOL: SHX5780

## 2019-08-06 SURGERY — COLONOSCOPY WITH PROPOFOL
Anesthesia: General

## 2019-08-06 MED ORDER — PROPOFOL 500 MG/50ML IV EMUL
INTRAVENOUS | Status: DC | PRN
  Administered 2019-08-06: 150 ug/kg/min via INTRAVENOUS

## 2019-08-06 MED ORDER — PROPOFOL 10 MG/ML IV BOLUS
INTRAVENOUS | Status: DC | PRN
  Administered 2019-08-06: 60 mg via INTRAVENOUS

## 2019-08-06 MED ORDER — SODIUM CHLORIDE 0.9 % IV SOLN
INTRAVENOUS | Status: DC
  Administered 2019-08-06: 1000 mL via INTRAVENOUS

## 2019-08-06 MED ORDER — LIDOCAINE HCL (PF) 1 % IJ SOLN
INTRAMUSCULAR | Status: AC
  Filled 2019-08-06: qty 2

## 2019-08-06 NOTE — Anesthesia Postprocedure Evaluation (Signed)
Anesthesia Post Note  Patient: Tommy Golden  Procedure(s) Performed: COLONOSCOPY WITH PROPOFOL (N/A )  Patient location during evaluation: Endoscopy Anesthesia Type: General Level of consciousness: awake and alert Pain management: pain level controlled Vital Signs Assessment: post-procedure vital signs reviewed and stable Respiratory status: spontaneous breathing, nonlabored ventilation, respiratory function stable and patient connected to nasal cannula oxygen Cardiovascular status: blood pressure returned to baseline and stable Postop Assessment: no apparent nausea or vomiting Anesthetic complications: no     Last Vitals:  Vitals:   08/06/19 1221 08/06/19 1231  BP: 132/67 134/64  Pulse:    Resp:    Temp:    SpO2: 99%     Last Pain:  Vitals:   08/06/19 1231  TempSrc:   PainSc: 0-No pain                 Char Feltman S

## 2019-08-06 NOTE — Transfer of Care (Signed)
Immediate Anesthesia Transfer of Care Note  Patient: Tommy Golden  Procedure(s) Performed: COLONOSCOPY WITH PROPOFOL (N/A )  Patient Location: PACU  Anesthesia Type:General  Level of Consciousness: sedated  Airway & Oxygen Therapy: Patient Spontanous Breathing and Patient connected to nasal cannula oxygen  Post-op Assessment: Report given to RN and Post -op Vital signs reviewed and stable  Post vital signs: Reviewed and stable  Last Vitals:  Vitals Value Taken Time  BP 143/73 08/06/19 1202  Temp    Pulse 64 08/06/19 1203  Resp 15 08/06/19 1203  SpO2 97 % 08/06/19 1203  Vitals shown include unvalidated device data.  Last Pain:  Vitals:   08/06/19 1201  TempSrc: (P) Tympanic  PainSc:          Complications: No apparent anesthesia complications

## 2019-08-06 NOTE — Op Note (Signed)
[facility] Gastroenterology Patient Name: Tommy Golden Procedure Date: 08/06/2019 11:19 AM MRN: [id number] Account #: [id number] Date of Birth: 1950-12-11 Admit Type: Outpatient Age: 68 Room: [facility] ENDO ROOM 2 Gender: Male Note Status: Finalized Procedure:            Colonoscopy Indications:          Colon cancer screening in patient at increased risk:                        Family history of 1st-degree relative with colon polyps Providers:            Benay Pike. [place] MD, MD Medicines:            Propofol per Anesthesia Complications:        No immediate complications. Procedure:            Pre-Anesthesia Assessment:                       - The risks and benefits of the procedure and the                        sedation options and risks were discussed with the                        patient. All questions were answered and informed                        consent was obtained.                       - Patient identification and proposed procedure were                        verified prior to the procedure by the nurse. The                        procedure was verified in the procedure room.                       - ASA Grade Assessment: III - A patient with severe                        systemic disease.                       - After reviewing the risks and benefits, the patient                        was deemed in satisfactory condition to undergo the                        procedure.                       After obtaining informed consent, the colonoscope was                        passed under direct vision. Throughout the procedure,                        the patient's blood pressure, pulse, and oxygen  saturations were monitored continuously. The                        Colonoscope was introduced through the anus and                        advanced to the the cecum, identified by appendiceal                        orifice and ileocecal  valve. The colonoscopy was                        performed without difficulty. The patient tolerated the                        procedure well. The quality of the bowel preparation                        was good. The ileocecal valve, appendiceal orifice, and                        rectum were photographed. Findings:      The perianal and digital rectal examinations were normal. Pertinent       negatives include normal sphincter tone and no palpable rectal lesions.      A 5 mm polyp was found in the transverse colon. The polyp was sessile.       The polyp was removed with a jumbo cold forceps. Resection and retrieval       were complete.      Non-bleeding internal hemorrhoids were found during retroflexion. The       hemorrhoids were Grade I (internal hemorrhoids that do not prolapse).      The exam was otherwise without abnormality. Impression:           - One 5 mm polyp in the transverse colon, removed with                        a jumbo cold forceps. Resected and retrieved.                       - Non-bleeding internal hemorrhoids.                       - The examination was otherwise normal. Recommendation:       - Patient has a contact number available for                        emergencies. The signs and symptoms of potential                        delayed complications were discussed with the patient.                        Return to normal activities tomorrow. Written discharge                        instructions were provided to the patient.                       - Resume previous diet.                       -  Continue present medications.                       - Await pathology results.                       - Repeat colonoscopy in 5 years for surveillance.                       - Return to GI office PRN.                       - The findings and recommendations were discussed with                        the patient.                       - Resume Plavix (clopidogrel) at prior  dose today. Procedure Code(s):    --- Professional ---                       [phone], Colonoscopy, flexible; with biopsy, single or                        multiple Diagnosis Code(s):    --- Professional ---                       K64.0, First degree hemorrhoids                       K63.5, Polyp of colon                       Z83.71, Family history of colonic polyps CPT copyright 2019 American Medical Association. All rights reserved. The codes documented in this report are preliminary and upon coder review may  be revised to meet current compliance requirements. Efrain Sella MD, MD 08/06/2019 12:04:40 PM This report has been signed electronically. Number of Addenda: 0 Note Initiated On: 08/06/2019 11:19 AM Scope Withdrawal Time: 0 hours 7 minutes 58 seconds  Total Procedure Duration: 0 hours 17 minutes 44 seconds  Estimated Blood Loss: Estimated blood loss: none.      [facility]

## 2019-08-06 NOTE — H&P (Signed)
Outpatient short stay form Pre-procedure 08/06/2019 9:34 AM  K. Alice Reichert, M.D.  Primary Physician: [place] Pink, M.D.  Reason for visit:  Family hx of colon polyps (adenoma)- Brother  History of present illness:   68 year old patient presenting for family history of colon cancer and polyps. Patient denies any change in bowel habits, rectal bleeding or involuntary weight loss.    No current facility-administered medications for this encounter.   Current Outpatient Medications:  .  ALPRAZolam (XANAX XR) 1 MG 24 hr tablet, Take 1 mg by mouth 2 (two) times daily., Disp: , Rfl:  .  aspirin EC 81 MG tablet, Take 81 mg by mouth daily., Disp: , Rfl:  .  atenolol (TENORMIN) 25 MG tablet, Take 25 mg by mouth 2 (two) times daily., Disp: , Rfl:  .  atorvastatin (LIPITOR) 20 MG tablet, Take 1 tablet (20 mg total) by mouth daily at 6 PM., Disp: 30 tablet, Rfl: 11 .  AZOPT 1 % ophthalmic suspension, Place 1 drop into both eyes 3 (three) times daily., Disp: , Rfl: 5 .  clopidogrel (PLAVIX) 75 MG tablet, Take 1 tablet (75 mg total) by mouth daily with breakfast., Disp: 30 tablet, Rfl: 11 .  enalapril (VASOTEC) 10 MG tablet, Take 10 mg by mouth 2 (two) times daily., Disp: , Rfl:  .  gemfibrozil (LOPID) 600 MG tablet, Take 600 mg by mouth daily., Disp: , Rfl:  .  mirtazapine (REMERON) 15 MG tablet, Take 7.5 mg by mouth at bedtime as needed (sleep). , Disp: , Rfl: 0 .  Multiple Vitamin (MULTIVITAMIN WITH MINERALS) TABS tablet, Take 1 tablet by mouth daily., Disp: , Rfl:  .  oxymetazoline (AFRIN) 0.05 % nasal spray, Place 1 spray into both nostrils at bedtime., Disp: , Rfl:  .  Saw Palmetto 450 MG CAPS, Take 450 mg by mouth daily., Disp: , Rfl:  .  sertraline (ZOLOFT) 100 MG tablet, Take 150 mg by mouth daily. Takes 1.5 tablet, Disp: , Rfl:  .  TRAVATAN Z 0.004 % SOLN ophthalmic solution, Place 1 drop into both eyes at bedtime., Disp: , Rfl: 2 .  triazolam (HALCION) 0.25 MG tablet, Take 0.5 mg by mouth  at bedtime. Takes 2 tablets at bedtime, Disp: , Rfl:  .  triazolam (HALCION) 0.25 MG tablet, Take by mouth., Disp: , Rfl:   No medications prior to admission.     Allergies  Allergen Reactions  . Prednisone Other (See Comments)    Other Reaction: increase IOP  . Penicillins Rash    Has patient had a PCN reaction causing immediate rash, facial/tongue/throat swelling, SOB or lightheadedness with hypotension: No Has patient had a PCN reaction causing severe rash involving mucus membranes or skin necrosis: No Has patient had a PCN reaction that required hospitalization: No Has patient had a PCN reaction occurring within the last 10 years: No If all of the above answers are "NO", then may proceed with Cephalosporin use.   . Brimonidine Other (See Comments)    Redness  . Brimonidine Tartrate-Timolol Other (See Comments)    redness  . Dorzolamide Other (See Comments)    Redness  . Dorzolamide Hcl-Timolol Mal Other (See Comments)    Redness  . Latanoprost Other (See Comments)    redness  . Timolol Maleate Other (See Comments)    redness     Past Medical History:  Diagnosis Date  . Arthritis   . Cataract   . Glaucoma   . Hx of basal cell carcinoma 2013  multiple sites   . Hx of dysplastic nevus 2010   multiple sites   . Hx of melanoma of skin 2004   multiple sites   . Hypertension   . Melanoma ([place])     Review of systems:  Otherwise negative.    Physical Exam  Gen: Alert, oriented. Appears stated age.  HEENT: [place]/AT. PERRLA. Lungs: CTA, no wheezes. CV: RR nl S1, S2. Abd: soft, benign, no masses. BS+ Ext: No edema. Pulses 2+    Planned procedures: Proceed with colonoscopy. The patient understands the nature of the planned procedure, indications, risks, alternatives and potential complications including but not limited to bleeding, infection, perforation, damage to internal organs and possible oversedation/side effects from anesthesia. The patient agrees and gives  consent to proceed.  Please refer to procedure notes for findings, recommendations and patient disposition/instructions.      K. Alice Reichert, M.D. Gastroenterology 08/06/2019  9:34 AM

## 2019-08-06 NOTE — Anesthesia Procedure Notes (Signed)
Date/Time: 08/06/2019 11:40 AM Performed by: Nelda Marseille, CRNA Pre-anesthesia Checklist: Patient identified, Emergency Drugs available, Suction available, Patient being monitored and Timeout performed Oxygen Delivery Method: Nasal cannula

## 2019-08-06 NOTE — Interval H&P Note (Signed)
History and Physical Interval Note:  08/06/2019 11:16 AM  Tommy Golden  has presented today for surgery, with the diagnosis of COLON CANCER SCREENING.  The various methods of treatment have been discussed with the patient and family. After consideration of risks, benefits and other options for treatment, the patient has consented to  Procedure(s): COLONOSCOPY WITH PROPOFOL (N/A) as a surgical intervention.  The patient's history has been reviewed, patient examined, no change in status, stable for surgery.  I have reviewed the patient's chart and labs.  Questions were answered to the patient's satisfaction.     [place], [place]

## 2019-08-06 NOTE — Anesthesia Preprocedure Evaluation (Signed)
Anesthesia Evaluation  Patient identified by MRN, date of birth, ID band Patient awake    Reviewed: Allergy & Precautions, NPO status , Patient's Chart, lab work & pertinent test results, reviewed documented beta blocker date and time   Airway Mallampati: II  TM Distance: >3 FB     Dental  (+) Chipped   Pulmonary           Cardiovascular hypertension, Pt. on medications and Pt. on home beta blockers + angina + Cardiac Stents       Neuro/Psych    GI/Hepatic   Endo/Other    Renal/GU      Musculoskeletal  (+) Arthritis ,   Abdominal   Peds  Hematology   Anesthesia Other Findings EKG from 2 yr ago ok.  Reproductive/Obstetrics                             Anesthesia Physical Anesthesia Plan  ASA: III  Anesthesia Plan: General   Post-op Pain Management:    Induction: Intravenous  PONV Risk Score and Plan:   Airway Management Planned:   Additional Equipment:   Intra-op Plan:   Post-operative Plan:   Informed Consent: I have reviewed the patients History and Physical, chart, labs and discussed the procedure including the risks, benefits and alternatives for the proposed anesthesia with the patient or authorized representative who has indicated his/her understanding and acceptance.       Plan Discussed with: CRNA  Anesthesia Plan Comments:         Anesthesia Quick Evaluation

## 2019-08-06 NOTE — Anesthesia Post-op Follow-up Note (Signed)
Anesthesia QCDR form completed.        

## 2019-08-07 ENCOUNTER — Encounter: Payer: Self-pay | Admitting: Internal Medicine

## 2019-08-07 LAB — SURGICAL PATHOLOGY

## 2020-02-17 ENCOUNTER — Other Ambulatory Visit: Payer: Self-pay

## 2020-02-17 ENCOUNTER — Ambulatory Visit: Payer: Medicare Other | Admitting: Dermatology

## 2020-02-17 DIAGNOSIS — L820 Inflamed seborrheic keratosis: Secondary | ICD-10-CM | POA: Diagnosis not present

## 2020-02-17 DIAGNOSIS — Z1283 Encounter for screening for malignant neoplasm of skin: Secondary | ICD-10-CM | POA: Diagnosis not present

## 2020-02-17 DIAGNOSIS — L821 Other seborrheic keratosis: Secondary | ICD-10-CM | POA: Diagnosis not present

## 2020-02-17 DIAGNOSIS — D1801 Hemangioma of skin and subcutaneous tissue: Secondary | ICD-10-CM

## 2020-02-17 DIAGNOSIS — Z85820 Personal history of malignant melanoma of skin: Secondary | ICD-10-CM

## 2020-02-17 DIAGNOSIS — D229 Melanocytic nevi, unspecified: Secondary | ICD-10-CM

## 2020-02-17 DIAGNOSIS — Z85828 Personal history of other malignant neoplasm of skin: Secondary | ICD-10-CM | POA: Diagnosis not present

## 2020-02-17 DIAGNOSIS — L578 Other skin changes due to chronic exposure to nonionizing radiation: Secondary | ICD-10-CM

## 2020-02-17 NOTE — Progress Notes (Signed)
   Follow-Up Visit   Subjective  Cadell Heyser is a 69 y.o. male who presents for the following: Annual Exam. Patient presents for total body skin exam for skin cancer screening. Patient has history of melanoma, dysplastic nevus, basal cell. He advises he had some blisters that started on his hands about 3 months ago then he noticed them at the neck. They did itch and were sore to the touch. Patient uses Cortisone-10. They have since resolved.   The following portions of the chart were reviewed this encounter and updated as appropriate: Tobacco  Allergies  Meds  Problems  Med Hx  Surg Hx  Fam Hx      Review of Systems: No other skin or systemic complaints.  Objective  Well appearing patient in no apparent distress; mood and affect are within normal limits.  A full examination was performed including scalp, head, eyes, ears, nose, lips, neck, chest, axillae, abdomen, back, buttocks, bilateral upper extremities, bilateral lower extremities, hands, feet, fingers, toes, fingernails, and toenails. All findings within normal limits unless otherwise noted below.  Objective  Left Temple: Well healed scar with no evidence of recurrence.   Objective  Right posterior axillary mid back lateral near side: Well healed scar with no evidence of recurrence, no lymphadenopathy.   Objective  Left Shoulder - Anterior: Erythematous keratotic or waxy stuck-on papule or plaque.   Assessment & Plan    Skin cancer screening performed today.  Seborrheic Keratoses - Stuck-on, waxy, tan-brown papules and plaques  - Discussed benign etiology and prognosis. - Observe - Call for any changes  Actinic Damage - diffuse scaly erythematous macules with underlying dyspigmentation - Recommend daily broad spectrum sunscreen SPF 30+ to sun-exposed areas, reapply every 2 hours as needed.  - Call for new or changing lesions.  Hemangiomas - Red papules - Discussed benign nature - Observe - Call for  any changes  Melanocytic Nevi - Tan-brown and/or pink-flesh-colored symmetric macules and papules - Benign appearing on exam today - Observation - Call clinic for new or changing moles - Recommend daily use of broad spectrum spf 30+ sunscreen to sun-exposed areas.   Acrochordons (Skin Tags) - Fleshy, skin-colored pedunculated papules - Benign appearing.  - Observe. - If desired, they can be removed with an in office procedure that is not covered by insurance. - Please call the clinic if you notice any new or changing lesions. History of basal cell carcinoma ([place]) Left Temple  11-15-2018 treated with Moh's  No evidence of recurrence, call clinic for new or changing lesions.     History of melanoma Right posterior axillary mid back lateral near side  Excised 10/11/2016  No evidence of recurrence, call clinic for new or changing lesions.   Inflamed seborrheic keratosis Left Shoulder - Anterior  Destruction of lesion - Left Shoulder - Anterior Complexity: simple   Destruction method: cryotherapy   Informed consent: discussed and consent obtained   Timeout:  patient name, date of birth, surgical site, and procedure verified Lesion destroyed using liquid nitrogen: Yes   Region frozen until ice ball extended beyond lesion: Yes   Outcome: patient tolerated procedure well with no complications   Post-procedure details: wound care instructions given    Return in about 6 months (around 08/18/2020) for TBSE.   Graciella Belton, RMA, am acting as scribe for Sarina Ser, MD . Documentation: I have reviewed the above documentation for accuracy and completeness, and I agree with the above.  Sarina Ser, MD

## 2020-02-17 NOTE — Patient Instructions (Addendum)
Cryotherapy Aftercare  . Wash gently with soap and water everyday.   . Apply Vaseline and Band-Aid daily until healed. Recommend daily broad spectrum sunscreen SPF 30+ to sun-exposed areas, reapply every 2 hours as needed. Call for new or changing lesions.  

## 2020-02-18 ENCOUNTER — Encounter: Payer: Self-pay | Admitting: Dermatology

## 2020-08-17 ENCOUNTER — Encounter: Payer: Self-pay | Admitting: Dermatology

## 2020-08-17 ENCOUNTER — Ambulatory Visit: Payer: Medicare PPO | Attending: Internal Medicine

## 2020-08-17 ENCOUNTER — Ambulatory Visit: Payer: Medicare PPO | Admitting: Dermatology

## 2020-08-17 ENCOUNTER — Other Ambulatory Visit: Payer: Self-pay

## 2020-08-17 DIAGNOSIS — Z86018 Personal history of other benign neoplasm: Secondary | ICD-10-CM | POA: Diagnosis not present

## 2020-08-17 DIAGNOSIS — L918 Other hypertrophic disorders of the skin: Secondary | ICD-10-CM

## 2020-08-17 DIAGNOSIS — Z86006 Personal history of melanoma in-situ: Secondary | ICD-10-CM

## 2020-08-17 DIAGNOSIS — Z23 Encounter for immunization: Secondary | ICD-10-CM

## 2020-08-17 DIAGNOSIS — L820 Inflamed seborrheic keratosis: Secondary | ICD-10-CM

## 2020-08-17 DIAGNOSIS — D1800 Hemangioma unspecified site: Secondary | ICD-10-CM

## 2020-08-17 DIAGNOSIS — D229 Melanocytic nevi, unspecified: Secondary | ICD-10-CM

## 2020-08-17 DIAGNOSIS — L578 Other skin changes due to chronic exposure to nonionizing radiation: Secondary | ICD-10-CM

## 2020-08-17 DIAGNOSIS — L821 Other seborrheic keratosis: Secondary | ICD-10-CM

## 2020-08-17 DIAGNOSIS — Z1283 Encounter for screening for malignant neoplasm of skin: Secondary | ICD-10-CM | POA: Diagnosis not present

## 2020-08-17 DIAGNOSIS — L570 Actinic keratosis: Secondary | ICD-10-CM

## 2020-08-17 DIAGNOSIS — L814 Other melanin hyperpigmentation: Secondary | ICD-10-CM

## 2020-08-17 NOTE — Progress Notes (Signed)
   Covid-19 Vaccination Clinic  Name:  [name]    MRN: [id number] DOB: 1951/02/11  08/17/2020  Tommy Golden was observed post Covid-19 immunization for 15 minutes without incident. He was provided with Vaccine Information Sheet and instruction to access the V-Safe system.   Mr. Witzke was instructed to call 911 with any severe reactions post vaccine: Marland Kitchen Difficulty breathing  . Swelling of face and throat  . A fast heartbeat  . A bad rash all over body  . Dizziness and weakness

## 2020-08-17 NOTE — Patient Instructions (Addendum)

## 2020-08-17 NOTE — Progress Notes (Signed)
Follow-Up Visit   Subjective  Tommy Golden is a 69 y.o. male who presents for the following: TBSE (Patient here today for 6 month TBSE. He has a h/o MMis, dysplastic nevi. ). The patient presents for Total-Body Skin Exam (TBSE) for skin cancer screening and mole check. Patient not aware of anything new or changing.  The following portions of the chart were reviewed this encounter and updated as appropriate:  Tobacco  Allergies  Meds  Problems  Med Hx  Surg Hx  Fam Hx     Review of Systems:  No other skin or systemic complaints except as noted in HPI or Assessment and Plan.  Objective  Well appearing patient in no apparent distress; mood and affect are within normal limits.  A full examination was performed including scalp, head, eyes, ears, nose, lips, neck, chest, axillae, abdomen, back, buttocks, bilateral upper extremities, bilateral lower extremities, hands, feet, fingers, toes, fingernails, and toenails. All findings within normal limits unless otherwise noted below.  Objective  Face (4): Erythematous thin papules/macules with gritty scale.   Objective  L Neck x 1, L thigh x 1, upper back x 1 (3): Erythematous keratotic or waxy stuck-on papule or plaque.    Assessment & Plan  AK (actinic keratosis) (4) Face  Destruction of lesion - Face Complexity: simple   Destruction method: cryotherapy   Informed consent: discussed and consent obtained   Timeout:  patient name, date of birth, surgical site, and procedure verified Lesion destroyed using liquid nitrogen: Yes   Region frozen until ice ball extended beyond lesion: Yes   Outcome: patient tolerated procedure well with no complications   Post-procedure details: wound care instructions given    Inflamed seborrheic keratosis (3) L Neck x 1, L thigh x 1, upper back x 1  Destruction of lesion - L Neck x 1, L thigh x 1, upper back x 1 Complexity: simple   Destruction method: cryotherapy   Informed consent:  discussed and consent obtained   Timeout:  patient name, date of birth, surgical site, and procedure verified Lesion destroyed using liquid nitrogen: Yes   Region frozen until ice ball extended beyond lesion: Yes   Outcome: patient tolerated procedure well with no complications   Post-procedure details: wound care instructions given     .Lentigines - Scattered tan macules - Discussed due to sun exposure - Benign, observe - Call for any changes  Seborrheic Keratoses - Stuck-on, waxy, tan-brown papules and plaques  - Discussed benign etiology and prognosis. - Observe - Call for any changes  Melanocytic Nevi - Tan-brown and/or pink-flesh-colored symmetric macules and papules - Benign appearing on exam today - Observation - Call clinic for new or changing moles - Recommend daily use of broad spectrum spf 30+ sunscreen to sun-exposed areas.   Hemangiomas - Red papules - Discussed benign nature - Observe - Call for any changes  Actinic Damage - diffuse scaly erythematous macules with underlying dyspigmentation - Recommend daily broad spectrum sunscreen SPF 30+ to sun-exposed areas, reapply every 2 hours as needed.  - Call for new or changing lesions.  Skin cancer screening performed today.  History of Dysplastic Nevi - No evidence of recurrence today - Recommend regular full body skin exams - Recommend daily broad spectrum sunscreen SPF 30+ to sun-exposed areas, reapply every 2 hours as needed.  - Call if any new or changing lesions are noted between office visits  History of Melanoma in Situ - No evidence of recurrence today at right post  axillary mid back lateral near side  - No lymphadenopathy - Recommend regular full body skin exams - Recommend daily broad spectrum sunscreen SPF 30+ to sun-exposed areas, reapply every 2 hours as needed.  - Call if any new or changing lesions are noted between office visits  Acrochordons (Skin Tags) - Fleshy, skin-colored  pedunculated papules - Benign appearing.  - Observe. - If desired, they can be removed with an in office procedure that is not covered by insurance. - Please call the clinic if you notice any new or changing lesions.  Return in about 6 months (around 02/15/2021) for TBSE.  Graciella Belton, RMA, am acting as scribe for Sarina Ser, MD . Documentation: I have reviewed the above documentation for accuracy and completeness, and I agree with the above.  Sarina Ser, MD

## 2020-12-02 ENCOUNTER — Telehealth: Payer: Self-pay

## 2020-12-02 MED ORDER — TRIAMCINOLONE ACETONIDE 0.1 % EX CREA: 1.0000 "application " | TOPICAL_CREAM | Freq: Two times a day (BID) | CUTANEOUS | 0 refills | Status: DC

## 2020-12-02 NOTE — Telephone Encounter (Signed)
RX sent in and patient came to office and advised.

## 2020-12-02 NOTE — Telephone Encounter (Signed)
Patient called asking for a RF of Triamcinolone cream. Patient states it was prescribed several years ago for a rash he gets on the back of his knees and uses it PRN.   Do you want to RF or see patient?  Patient was last here in October for yearly check.

## 2020-12-02 NOTE — Telephone Encounter (Signed)
May give Rx for Triamcinolone 0.1% cream bid prn up to 5 days per week for eczema of legs disp 80 g with no RF.

## 2021-02-08 ENCOUNTER — Ambulatory Visit: Payer: Medicare PPO | Admitting: Dermatology

## 2021-02-08 ENCOUNTER — Other Ambulatory Visit: Payer: Self-pay

## 2021-02-08 ENCOUNTER — Encounter: Payer: Self-pay | Admitting: Dermatology

## 2021-02-08 DIAGNOSIS — Z1283 Encounter for screening for malignant neoplasm of skin: Secondary | ICD-10-CM

## 2021-02-08 DIAGNOSIS — L821 Other seborrheic keratosis: Secondary | ICD-10-CM

## 2021-02-08 DIAGNOSIS — L578 Other skin changes due to chronic exposure to nonionizing radiation: Secondary | ICD-10-CM

## 2021-02-08 DIAGNOSIS — D1800 Hemangioma unspecified site: Secondary | ICD-10-CM

## 2021-02-08 DIAGNOSIS — L308 Other specified dermatitis: Secondary | ICD-10-CM

## 2021-02-08 DIAGNOSIS — Z86018 Personal history of other benign neoplasm: Secondary | ICD-10-CM

## 2021-02-08 DIAGNOSIS — D229 Melanocytic nevi, unspecified: Secondary | ICD-10-CM

## 2021-02-08 DIAGNOSIS — Z85828 Personal history of other malignant neoplasm of skin: Secondary | ICD-10-CM | POA: Diagnosis not present

## 2021-02-08 DIAGNOSIS — Z85820 Personal history of malignant melanoma of skin: Secondary | ICD-10-CM | POA: Diagnosis not present

## 2021-02-08 DIAGNOSIS — L814 Other melanin hyperpigmentation: Secondary | ICD-10-CM

## 2021-02-08 DIAGNOSIS — L820 Inflamed seborrheic keratosis: Secondary | ICD-10-CM | POA: Diagnosis not present

## 2021-02-08 NOTE — Patient Instructions (Signed)
Melanoma ABCDEs  Melanoma is the most dangerous type of skin cancer, and is the leading cause of death from skin disease.  You are more likely to develop melanoma if you:  Have light-colored skin, light-colored eyes, or red or blond hair  Spend a lot of time in the sun  Tan regularly, either outdoors or in a tanning bed  Have had blistering sunburns, especially during childhood  Have a close family member who has had a melanoma  Have atypical moles or large birthmarks  Early detection of melanoma is key since treatment is typically straightforward and cure rates are extremely high if we catch it early.   The first sign of melanoma is often a change in a mole or a new dark spot.  The ABCDE system is a way of remembering the signs of melanoma.  A for asymmetry:  The two halves do not match. B for border:  The edges of the growth are irregular. C for color:  A mixture of colors are present instead of an even brown color. D for diameter:  Melanomas are usually (but not always) greater than 61mm - the size of a pencil eraser. E for evolution:  The spot keeps changing in size, shape, and color.  Please check your skin once per month between visits. You can use a small mirror in front and a large mirror behind you to keep an eye on the back side or your body.   If you see any new or changing lesions before your next follow-up, please call to schedule a visit.  Please continue daily skin protection including broad spectrum sunscreen SPF 30+ to sun-exposed areas, reapplying every 2 hours as needed when you're outdoors.   Staying in the shade or wearing long sleeves, sun glasses (UVA+UVB protection) and wide brim hats (4-inch brim around the entire circumference of the hat) are also recommended for sun protection.   Cryotherapy Aftercare  . Wash gently with soap and water everyday.   Marland Kitchen Apply Vaseline and Band-Aid daily until healed.  If you have any questions or concerns for your doctor,  please call our main line at [phone] and press option 4 to reach your doctor's medical assistant. If no one answers, please leave a voicemail as directed and we will return your call as soon as possible. Messages left after 4 pm will be answered the following business day.   You may also send [place] a message via [employer]. We typically respond to MyChart messages within 1-2 business days.  For prescription refills, please ask your pharmacy to contact our office. Our fax number is [phone].  If you have an urgent issue when the clinic is closed that cannot wait until the next business day, you can page your doctor at the number below.    Please note that while we do our best to be available for urgent issues outside of office hours, we are not available 24/7.   If you have an urgent issue and are unable to reach [place], you may choose to seek medical care at your doctor's office, retail clinic, urgent care center, or emergency room.  If you have a medical emergency, please immediately call 911 or go to the emergency department.  Pager Numbers  - Dr. Nehemiah Massed: [phone]  - Dr. Laurence Ferrari: [phone]  - Dr. Nicole Kindred: [phone]  In the event of inclement weather, please call our main line at [phone] for an update on the status of any delays or closures.  Dermatology Medication Tips:  Please keep the boxes that topical medications come in in order to help keep track of the instructions about where and how to use these. Pharmacies typically print the medication instructions only on the boxes and not directly on the medication tubes.   If your medication is too expensive, please contact our office at [phone] option 4 or send [place] a message through [place].   We are unable to tell what your co-pay for medications will be in advance as this is different depending on your insurance coverage. However, we may be able to find a substitute medication at lower cost or fill out paperwork to get  insurance to cover a needed medication.   If a prior authorization is required to get your medication covered by your insurance company, please allow [place] 1-2 business days to complete this process.  Drug prices often vary depending on where the prescription is filled and some pharmacies may offer cheaper prices.  The website www.goodrx.com contains coupons for medications through different pharmacies. The prices here do not account for what the cost may be with help from insurance (it may be cheaper with your insurance), but the website can give you the price if you did not use any insurance.  - You can print the associated coupon and take it with your prescription to the pharmacy.  - You may also stop by our office during regular business hours and pick up a GoodRx coupon card.  - If you need your prescription sent electronically to a different pharmacy, notify our office through [facility] or by phone at [phone] option 4.

## 2021-02-08 NOTE — Progress Notes (Signed)
Follow-Up Visit   Subjective  Tommy Golden is a 70 y.o. male who presents for the following: tbse (Patient here today for tbse. Patient has history of melanoma on back and right posterior axillary mid back. He also has history of several bcc's on left zygoma and left temple. He reports he had a spot that was not healing right side burn area. He reports scab fell off a few days ago. ). Patient here for full body skin exam and skin cancer screening.  The following portions of the chart were reviewed this encounter and updated as appropriate:  Tobacco  Allergies  Meds  Problems  Med Hx  Surg Hx  Fam Hx      Objective  Well appearing patient in no apparent distress; mood and affect are within normal limits.  A full examination was performed including scalp, head, eyes, ears, nose, lips, neck, chest, axillae, abdomen, back, buttocks, bilateral upper extremities, bilateral lower extremities, hands, feet, fingers, toes, fingernails, and toenails. All findings within normal limits unless otherwise noted below.  Objective  right temple/sideburn area x 1, left neck x 1, right lower back x 1, (3): Erythematous keratotic or waxy stuck-on papule or plaque.   Assessment & Plan  Inflamed seborrheic keratosis (3) right temple/sideburn area x 1, left neck x 1, right lower back x 1, Prior to procedure, discussed risks of blister formation, small wound, skin dyspigmentation, or rare scar following cryotherapy.   Destruction of lesion - right temple/sideburn area x 1, left neck x 1, right lower back x 1, Complexity: simple   Destruction method: cryotherapy   Informed consent: discussed and consent obtained   Timeout:  patient name, date of birth, surgical site, and procedure verified Lesion destroyed using liquid nitrogen: Yes   Region frozen until ice ball extended beyond lesion: Yes   Outcome: patient tolerated procedure well with no complications   Post-procedure details: wound care  instructions given    Other eczema Legs; popliteal areas Atopic dermatitis (eczema) is a chronic, relapsing, pruritic condition that can significantly affect quality of life. It is often associated with allergic rhinitis and/or asthma and can require treatment with topical medications, phototherapy, or in severe cases a biologic medication called Dupixent in older children and adults.  Triamcinolone 0.1% topically prn  Skin cancer screening   Lentigines - Scattered tan macules - Due to sun exposure - Benign-appering, observe - Recommend daily broad spectrum sunscreen SPF 30+ to sun-exposed areas, reapply every 2 hours as needed. - Call for any changes  Seborrheic Keratoses - Stuck-on, waxy, tan-brown papules and/or plaques  - Benign-appearing - Discussed benign etiology and prognosis. - Observe - Call for any changes  Melanocytic Nevi - Tan-brown and/or pink-flesh-colored symmetric macules and papules - Benign appearing on exam today - Observation - Call clinic for new or changing moles - Recommend daily use of broad spectrum spf 30+ sunscreen to sun-exposed areas.   Hemangiomas - Red papules - Discussed benign nature - Observe - Call for any changes  Actinic Damage - Chronic condition, secondary to cumulative UV/sun exposure - diffuse scaly erythematous macules with underlying dyspigmentation - Recommend daily broad spectrum sunscreen SPF 30+ to sun-exposed areas, reapply every 2 hours as needed.  - Staying in the shade or wearing long sleeves, sun glasses (UVA+UVB protection) and wide brim hats (4-inch brim around the entire circumference of the hat) are also recommended for sun protection.  - Call for new or changing lesions.  History of Dysplastic Nevi - No  evidence of recurrence today - Recommend regular full body skin exams - Recommend daily broad spectrum sunscreen SPF 30+ to sun-exposed areas, reapply every 2 hours as needed.  - Call if any new or changing  lesions are noted between office visits  History of Basal Cell Carcinoma of the Skin - No evidence of recurrence today on left zygoma anterior to sideburn, left zygoma and left temple  - Recommend regular full body skin exams - Recommend daily broad spectrum sunscreen SPF 30+ to sun-exposed areas, reapply every 2 hours as needed.  - Call if any new or changing lesions are noted between office visits  History of Melanoma - No evidence of recurrence today on back and right posterior axillary mid back  - No lymphadenopathy - Recommend regular full body skin exams - Recommend daily broad spectrum sunscreen SPF 30+ to sun-exposed areas, reapply every 2 hours as needed.  - Call if any new or changing lesions are noted between office visits  Skin cancer screening performed today.  Return in about 6 months (around 08/10/2021) for tbse.  IRuthell Rummage, CMA, am acting as scribe for Sarina Ser, MD.  Documentation: I have reviewed the above documentation for accuracy and completeness, and I agree with the above.  Sarina Ser, MD

## 2021-08-09 DIAGNOSIS — Z23 Encounter for immunization: Secondary | ICD-10-CM | POA: Diagnosis not present

## 2021-08-16 ENCOUNTER — Ambulatory Visit: Payer: Medicare PPO | Admitting: Dermatology

## 2021-08-16 ENCOUNTER — Other Ambulatory Visit: Payer: Self-pay

## 2021-08-16 ENCOUNTER — Encounter: Payer: Self-pay | Admitting: Dermatology

## 2021-08-16 DIAGNOSIS — Z85828 Personal history of other malignant neoplasm of skin: Secondary | ICD-10-CM | POA: Diagnosis not present

## 2021-08-16 DIAGNOSIS — Z1283 Encounter for screening for malignant neoplasm of skin: Secondary | ICD-10-CM

## 2021-08-16 DIAGNOSIS — D1800 Hemangioma unspecified site: Secondary | ICD-10-CM

## 2021-08-16 DIAGNOSIS — Z85820 Personal history of malignant melanoma of skin: Secondary | ICD-10-CM | POA: Diagnosis not present

## 2021-08-16 DIAGNOSIS — L820 Inflamed seborrheic keratosis: Secondary | ICD-10-CM

## 2021-08-16 DIAGNOSIS — L578 Other skin changes due to chronic exposure to nonionizing radiation: Secondary | ICD-10-CM

## 2021-08-16 DIAGNOSIS — D229 Melanocytic nevi, unspecified: Secondary | ICD-10-CM

## 2021-08-16 DIAGNOSIS — L821 Other seborrheic keratosis: Secondary | ICD-10-CM

## 2021-08-16 DIAGNOSIS — Z86018 Personal history of other benign neoplasm: Secondary | ICD-10-CM

## 2021-08-16 DIAGNOSIS — L814 Other melanin hyperpigmentation: Secondary | ICD-10-CM

## 2021-08-16 NOTE — Patient Instructions (Addendum)
Melanoma ABCDEs  Melanoma is the most dangerous type of skin cancer, and is the leading cause of death from skin disease.  You are more likely to develop melanoma if you: Have light-colored skin, light-colored eyes, or red or blond hair Spend a lot of time in the sun Tan regularly, either outdoors or in a tanning bed Have had blistering sunburns, especially during childhood Have a close family member who has had a melanoma Have atypical moles or large birthmarks  Early detection of melanoma is key since treatment is typically straightforward and cure rates are extremely high if we catch it early.   The first sign of melanoma is often a change in a mole or a new dark spot.  The ABCDE system is a way of remembering the signs of melanoma.  A for asymmetry:  The two halves do not match. B for border:  The edges of the growth are irregular. C for color:  A mixture of colors are present instead of an even brown color. D for diameter:  Melanomas are usually (but not always) greater than 11mm - the size of a pencil eraser. E for evolution:  The spot keeps changing in size, shape, and color.  Please check your skin once per month between visits. You can use a small mirror in front and a large mirror behind you to keep an eye on the back side or your body.   If you see any new or changing lesions before your next follow-up, please call to schedule a visit.  Please continue daily skin protection including broad spectrum sunscreen SPF 30+ to sun-exposed areas, reapplying every 2 hours as needed when you're outdoors.   Staying in the shade or wearing long sleeves, sun glasses (UVA+UVB protection) and wide brim hats (4-inch brim around the entire circumference of the hat) are also recommended for sun protection.    Prior to procedure, discussed risks of blister formation, small wound, skin dyspigmentation, or rare scar following cryotherapy. Recommend Vaseline ointment to treated areas while healing.    Cryotherapy Aftercare  Wash gently with soap and water everyday.   Apply Vaseline and Band-Aid daily until healed.

## 2021-08-16 NOTE — Progress Notes (Signed)
Follow-Up Visit   Subjective  Tommy Golden is a 70 y.o. male who presents for the following: Annual Exam (Patient here today for tbse. 6 month screening. Patient has history of melanoma on back and right posterior axillary mid back. He also has history of several bcc's on left zygoma and left temple.). Patient had glaucoma surgery recently causing his eyes to be red.  The patient presents for Total-Body Skin Exam (TBSE) for skin cancer screening and mole check.   The following portions of the chart were reviewed this encounter and updated as appropriate:  Tobacco  Allergies  Meds  Problems  Med Hx  Surg Hx  Fam Hx     Review of Systems: No other skin or systemic complaints except as noted in HPI or Assessment and Plan.  Objective  Well appearing patient in no apparent distress; mood and affect are within normal limits.  A full examination was performed including scalp, head, eyes, ears, nose, lips, neck, chest, axillae, abdomen, back, buttocks, bilateral upper extremities, bilateral lower extremities, hands, feet, fingers, toes, fingernails, and toenails. All findings within normal limits unless otherwise noted below.  trunk x19 (19) Erythematous keratotic or waxy stuck-on papule or plaque.   Assessment & Plan   Eye surgery for glaucoma 3 weeks ago.  Causing erythema at B/L eyes.   Inflamed seborrheic keratosis trunk x19  Destruction of lesion - trunk x19 Complexity: simple   Destruction method: cryotherapy   Informed consent: discussed and consent obtained   Timeout:  patient name, date of birth, surgical site, and procedure verified Lesion destroyed using liquid nitrogen: Yes   Region frozen until ice ball extended beyond lesion: Yes   Outcome: patient tolerated procedure well with no complications   Post-procedure details: wound care instructions given    Skin cancer screening  Lentigines - Scattered tan macules - Due to sun exposure - Benign-appearing,  observe - Recommend daily broad spectrum sunscreen SPF 30+ to sun-exposed areas, reapply every 2 hours as needed. - Call for any changes  Seborrheic Keratoses - Stuck-on, waxy, tan-brown papules and/or plaques  - Benign-appearing - Discussed benign etiology and prognosis. - Observe - Call for any changes  Melanocytic Nevi - Tan-brown and/or pink-flesh-colored symmetric macules and papules - Benign appearing on exam today - Observation - Call clinic for new or changing moles - Recommend daily use of broad spectrum spf 30+ sunscreen to sun-exposed areas.   Hemangiomas - Red papules - Discussed benign nature - Observe - Call for any changes  Actinic Damage - Chronic condition, secondary to cumulative UV/sun exposure - diffuse scaly erythematous macules with underlying dyspigmentation - Recommend daily broad spectrum sunscreen SPF 30+ to sun-exposed areas, reapply every 2 hours as needed.  - Staying in the shade or wearing long sleeves, sun glasses (UVA+UVB protection) and wide brim hats (4-inch brim around the entire circumference of the hat) are also recommended for sun protection.  - Call for new or changing lesions.  Skin cancer screening performed today.  History of Dysplastic Nevi - No evidence of recurrence today - Recommend regular full body skin exams - Recommend daily broad spectrum sunscreen SPF 30+ to sun-exposed areas, reapply every 2 hours as needed.  - Call if any new or changing lesions are noted between office visits   History of Basal Cell Carcinoma of the Skin - No evidence of recurrence today on left zygoma anterior to sideburn, left zygoma and left temple  - Recommend regular full body skin exams - Recommend  daily broad spectrum sunscreen SPF 30+ to sun-exposed areas, reapply every 2 hours as needed.  - Call if any new or changing lesions are noted between office visits   History of Melanoma - No evidence of recurrence today on back and right posterior  axillary mid back  - No lymphadenopathy - Recommend regular full body skin exams - Recommend daily broad spectrum sunscreen SPF 30+ to sun-exposed areas, reapply every 2 hours as needed.  - Call if any new or changing lesions are noted between office visits  Return in about 6 months (around 02/14/2022) for TBSE, HxMM.  I, Emelia Salisbury, CMA, am acting as scribe for Sarina Ser, MD. Documentation: I have reviewed the above documentation for accuracy and completeness, and I agree with the above.  Sarina Ser, MD

## 2021-08-17 ENCOUNTER — Encounter: Payer: Self-pay | Admitting: Dermatology

## 2022-02-21 ENCOUNTER — Ambulatory Visit: Payer: Medicare PPO | Admitting: Dermatology

## 2022-02-21 ENCOUNTER — Encounter: Payer: Self-pay | Admitting: Dermatology

## 2022-02-21 DIAGNOSIS — Z1283 Encounter for screening for malignant neoplasm of skin: Secondary | ICD-10-CM | POA: Diagnosis not present

## 2022-02-21 DIAGNOSIS — D2271 Melanocytic nevi of right lower limb, including hip: Secondary | ICD-10-CM | POA: Diagnosis not present

## 2022-02-21 DIAGNOSIS — Z85828 Personal history of other malignant neoplasm of skin: Secondary | ICD-10-CM

## 2022-02-21 DIAGNOSIS — Z85820 Personal history of malignant melanoma of skin: Secondary | ICD-10-CM | POA: Diagnosis not present

## 2022-02-21 DIAGNOSIS — L821 Other seborrheic keratosis: Secondary | ICD-10-CM

## 2022-02-21 DIAGNOSIS — Z86018 Personal history of other benign neoplasm: Secondary | ICD-10-CM

## 2022-02-21 DIAGNOSIS — L570 Actinic keratosis: Secondary | ICD-10-CM

## 2022-02-21 DIAGNOSIS — L578 Other skin changes due to chronic exposure to nonionizing radiation: Secondary | ICD-10-CM | POA: Diagnosis not present

## 2022-02-21 DIAGNOSIS — L814 Other melanin hyperpigmentation: Secondary | ICD-10-CM

## 2022-02-21 DIAGNOSIS — D229 Melanocytic nevi, unspecified: Secondary | ICD-10-CM

## 2022-02-21 DIAGNOSIS — D492 Neoplasm of unspecified behavior of bone, soft tissue, and skin: Secondary | ICD-10-CM

## 2022-02-21 DIAGNOSIS — D1800 Hemangioma unspecified site: Secondary | ICD-10-CM

## 2022-02-21 NOTE — Patient Instructions (Addendum)
Wound Care Instructions ? ?Cleanse wound gently with soap and water once a day then pat dry with clean gauze. Apply a thing coat of Petrolatum (petroleum jelly, "Vaseline") over the wound (unless you have an allergy to this). We recommend that you use a new, sterile tube of Vaseline. Do not pick or remove scabs. Do not remove the yellow or white "healing tissue" from the base of the wound. ? ?Cover the wound with fresh, clean, nonstick gauze and secure with paper tape. You may use Band-Aids in place of gauze and tape if the would is small enough, but would recommend trimming much of the tape off as there is often too much. Sometimes Band-Aids can irritate the skin. ? ?You should call the office for your biopsy report after 1 week if you have not already been contacted. ? ?If you experience any problems, such as abnormal amounts of bleeding, swelling, significant bruising, significant pain, or evidence of infection, please call the office immediately. ? ?FOR ADULT SURGERY PATIENTS: If you need something for pain relief you may take 1 extra strength Tylenol (acetaminophen) AND 2 Ibuprofen ('200mg'$  each) together every 4 hours as needed for pain. (do not take these if you are allergic to them or if you have a reason you should not take them.) Typically, you may only need pain medication for 1 to 3 days.  ? ? ? ?Cryotherapy Aftercare ? ?Wash gently with soap and water everyday.   ?Apply Vaseline and Band-Aid daily until healed.  ? ?Prior to procedure, discussed risks of blister formation, small wound, skin dyspigmentation, or rare scar following cryotherapy. Recommend Vaseline ointment to treated areas while healing.  ? ? ?Recommend daily broad spectrum sunscreen SPF 30+ to sun-exposed areas, reapply every 2 hours as needed. Call for new or changing lesions.  ?Staying in the shade or wearing long sleeves, sun glasses (UVA+UVB protection) and wide brim hats (4-inch brim around the entire circumference of the hat) are also  recommended for sun protection.  ? ? ?Melanoma ABCDEs ? ?Melanoma is the most dangerous type of skin cancer, and is the leading cause of death from skin disease.  You are more likely to develop melanoma if you: ?Have light-colored skin, light-colored eyes, or red or blond hair ?Spend a lot of time in the sun ?Tan regularly, either outdoors or in a tanning bed ?Have had blistering sunburns, especially during childhood ?Have a close family member who has had a melanoma ?Have atypical moles or large birthmarks ? ?Early detection of melanoma is key since treatment is typically straightforward and cure rates are extremely high if we catch it early.  ? ?The first sign of melanoma is often a change in a mole or a new dark spot.  The ABCDE system is a way of remembering the signs of melanoma. ? ?A for asymmetry:  The two halves do not match. ?B for border:  The edges of the growth are irregular. ?C for color:  A mixture of colors are present instead of an even brown color. ?D for diameter:  Melanomas are usually (but not always) greater than 47m - the size of a pencil eraser. ?E for evolution:  The spot keeps changing in size, shape, and color. ? ?Please check your skin once per month between visits. You can use a small mirror in front and a large mirror behind you to keep an eye on the back side or your body.  ? ?If you see any new or changing lesions before your  next follow-up, please call to schedule a visit. ? ?Please continue daily skin protection including broad spectrum sunscreen SPF 30+ to sun-exposed areas, reapplying every 2 hours as needed when you're outdoors.  ? ?Staying in the shade or wearing long sleeves, sun glasses (UVA+UVB protection) and wide brim hats (4-inch brim around the entire circumference of the hat) are also recommended for sun protection.   ? ?If You Need Anything After Your Visit ? ?If you have any questions or concerns for your doctor, please call our main line at [phone] and press  option 4 to reach your doctor's medical assistant. If no one answers, please leave a voicemail as directed and we will return your call as soon as possible. Messages left after 4 pm will be answered the following business day.  ? ?You may also send [place] a message via MyChart. We typically respond to MyChart messages within 1-2 business days. ? ?For prescription refills, please ask your pharmacy to contact our office. Our fax number is [phone]. ? ?If you have an urgent issue when the clinic is closed that cannot wait until the next business day, you can page your doctor at the number below.   ? ?Please note that while we do our best to be available for urgent issues outside of office hours, we are not available 24/7.  ? ?If you have an urgent issue and are unable to reach [place], you may choose to seek medical care at your doctor's office, retail clinic, urgent care center, or emergency room. ? ?If you have a medical emergency, please immediately call 911 or go to the emergency department. ? ?Pager Numbers ? ?- Dr. Nehemiah Massed: [phone] ? ?- Dr. Laurence Ferrari: [phone] ? ?- Dr. Nicole Kindred: [phone] ? ?In the event of inclement weather, please call our main line at [phone] for an update on the status of any delays or closures. ? ?Dermatology Medication Tips: ?Please keep the boxes that topical medications come in in order to help keep track of the instructions about where and how to use these. Pharmacies typically print the medication instructions only on the boxes and not directly on the medication tubes.  ? ?If your medication is too expensive, please contact our office at [phone] option 4 or send [place] a message through [place].  ? ?We are unable to tell what your co-pay for medications will be in advance as this is different depending on your insurance coverage. However, we may be able to find a substitute medication at lower cost or fill out paperwork to get insurance to cover a needed medication.  ? ?If a  prior authorization is required to get your medication covered by your insurance company, please allow [place] 1-2 business days to complete this process. ? ?Drug prices often vary depending on where the prescription is filled and some pharmacies may offer cheaper prices. ? ?The website www.goodrx.com contains coupons for medications through different pharmacies. The prices here do not account for what the cost may be with help from insurance (it may be cheaper with your insurance), but the website can give you the price if you did not use any insurance.  ?- You can print the associated coupon and take it with your prescription to the pharmacy.  ?- You may also stop by our office during regular business hours and pick up a GoodRx coupon card.  ?- If you need your prescription sent electronically to a different pharmacy, notify our office through [facility] or by phone at [phone] option  4. ? ? ? ? ?Si Usted Necesita Algo Despu?s de Su Visita ? ?Tambi?n puede enviarnos un mensaje a trav?s de MyChart. Por lo general respondemos a los mensajes de MyChart en el transcurso de 1 a 2 d?as h?biles. ? ?Para renovar recetas, por favor pida a su farmacia que se ponga en contacto con nuestra oficina. Nuestro n?mero de fax es el [phone]. ? ?Si tiene un asunto urgente cuando la cl?nica est? cerrada y que no puede esperar hasta el siguiente d?a h?bil, puede llamar/localizar a su doctor(a) al n?mero que aparece a continuaci?n.  ? ?Por favor, tenga en cuenta que aunque hacemos todo lo posible para estar disponibles para asuntos urgentes fuera del horario de oficina, no estamos disponibles las 24 horas del d?a, los 7 d?as de la semana.  ? ?Si tiene un problema urgente y no puede comunicarse con nosotros, puede optar por buscar atenci?n m?dica  en el consultorio de su doctor(a), en una cl?nica privada, en un centro de atenci?n urgente o en una sala de emergencias. ? ?Si tiene Engineer, maintenance (IT) m?dica, por favor llame  inmediatamente al 911 o vaya a la sala de emergencias. ? ?N?meros de b?per ? ?- Dr. Nehemiah Massed: [phone] ? ?- Dra. Moye: [phone] ? ?- Dra. Nicole Kindred: [phone] ? ?En caso de inclemencias del tiempo, por favo

## 2022-02-21 NOTE — Progress Notes (Signed)
? ?Follow-Up Visit ?  ?Subjective  ?Tommy Golden is a 71 y.o. male who presents for the following: Annual Exam (Here for skin cancer screening. Hx of MM x2 on back. Hx of multiple BCC's and dysplastic nevi). ?The patient presents for Total-Body Skin Exam (TBSE) for skin cancer screening and mole check.  The patient has spots, moles and lesions to be evaluated, some may be new or changing and the patient has concerns that these could be cancer. ? ?The following portions of the chart were reviewed this encounter and updated as appropriate:  Tobacco  Allergies  Meds  Problems  Med Hx  Surg Hx  Fam Hx   ?  ?Review of Systems: No other skin or systemic complaints except as noted in HPI or Assessment and Plan. ? ?Objective  ?Well appearing patient in no apparent distress; mood and affect are within normal limits. ? ?A full examination was performed including scalp, head, eyes, ears, nose, lips, neck, chest, axillae, abdomen, back, buttocks, bilateral upper extremities, bilateral lower extremities, hands, feet, fingers, toes, fingernails, and toenails. All findings within normal limits unless otherwise noted below. ? ?Right Ear x2 (2) ?Erythematous thin papules/macules with gritty scale.  ? ?Right medial pretibial ?0.6 cm med brown irregular macule ? ? ? ? ? ?Assessment & Plan  ? ?History of Melanoma.  Right posterior axillary mid back lat. near side 2017. Right mid back lateral 2004 ?- No evidence of recurrence today ?- No lymphadenopathy ?- Recommend regular full body skin exams ?- Recommend daily broad spectrum sunscreen SPF 30+ to sun-exposed areas, reapply every 2 hours as needed.  ?- Call if any new or changing lesions are noted between office visits  ? ?History of Basal Cell Carcinoma of the Skin. Multiple sites, see history. ?- No evidence of recurrence today ?- Recommend regular full body skin exams ?- Recommend daily broad spectrum sunscreen SPF 30+ to sun-exposed areas, reapply every 2 hours as needed.   ?- Call if any new or changing lesions are noted between office visits  ? ?History of Dysplastic Nevi. Multiple sites, see history.  ?- No evidence of recurrence today ?- Recommend regular full body skin exams ?- Recommend daily broad spectrum sunscreen SPF 30+ to sun-exposed areas, reapply every 2 hours as needed.  ?- Call if any new or changing lesions are noted between office visits  ? ?Lentigines ?- Scattered tan macules ?- Due to sun exposure ?- Benign-appearing, observe ?- Recommend daily broad spectrum sunscreen SPF 30+ to sun-exposed areas, reapply every 2 hours as needed. ?- Call for any changes ? ?Seborrheic Keratoses ?- Stuck-on, waxy, tan-brown papules and/or plaques  ?- Benign-appearing ?- Discussed benign etiology and prognosis. ?- Observe ?- Call for any changes ? ?Melanocytic Nevi ?- Tan-brown and/or pink-flesh-colored symmetric macules and papules ?- Benign appearing on exam today ?- Observation ?- Call clinic for new or changing moles ?- Recommend daily use of broad spectrum spf 30+ sunscreen to sun-exposed areas.  ? ?Hemangiomas ?- Red papules ?- Discussed benign nature ?- Observe ?- Call for any changes ? ?Actinic Damage ?- Chronic condition, secondary to cumulative UV/sun exposure ?- diffuse scaly erythematous macules with underlying dyspigmentation ?- Recommend daily broad spectrum sunscreen SPF 30+ to sun-exposed areas, reapply every 2 hours as needed.  ?- Staying in the shade or wearing long sleeves, sun glasses (UVA+UVB protection) and wide brim hats (4-inch brim around the entire circumference of the hat) are also recommended for sun protection.  ?- Call for new or changing  lesions. ? ?Skin cancer screening performed today. ? ?AK (actinic keratosis) (2) ?Right Ear x2 ? ?Actinic keratoses are precancerous spots that appear secondary to cumulative UV radiation exposure/sun exposure over time. They are chronic with expected duration over 1 year. A portion of actinic keratoses will progress to  squamous cell carcinoma of the skin. It is not possible to reliably predict which spots will progress to skin cancer and so treatment is recommended to prevent development of skin cancer. ? ?Recommend daily broad spectrum sunscreen SPF 30+ to sun-exposed areas, reapply every 2 hours as needed.  ?Recommend staying in the shade or wearing long sleeves, sun glasses (UVA+UVB protection) and wide brim hats (4-inch brim around the entire circumference of the hat). ?Call for new or changing lesions. ? ?Destruction of lesion - Right Ear x2 ?Complexity: simple   ?Destruction method: cryotherapy   ?Informed consent: discussed and consent obtained   ?Timeout:  patient name, date of birth, surgical site, and procedure verified ?Lesion destroyed using liquid nitrogen: Yes   ?Region frozen until ice ball extended beyond lesion: Yes   ?Outcome: patient tolerated procedure well with no complications   ?Post-procedure details: wound care instructions given   ? ?Neoplasm of skin ?Right medial pretibial ? ?Epidermal / dermal shaving ? ?Lesion diameter (cm):  0.6 ?Informed consent: discussed and consent obtained   ?Timeout: patient name, date of birth, surgical site, and procedure verified   ?Procedure prep:  Patient was prepped and draped in usual sterile fashion ?Prep type:  Isopropyl alcohol ?Anesthesia: the lesion was anesthetized in a standard fashion   ?Anesthetic:  1% lidocaine w/ epinephrine 1-100,000 buffered w/ 8.4% NaHCO3 ?Instrument used: flexible razor blade   ?Hemostasis achieved with: pressure, aluminum chloride and electrodesiccation   ?Outcome: patient tolerated procedure well   ?Post-procedure details: sterile dressing applied and wound care instructions given   ?Dressing type: bandage and petrolatum   ? ?Specimen 1 - Surgical pathology ?Differential Diagnosis: Nevus vs Dysplastic nevus ? ?Check Margins: No ? ?Skin cancer screening ? ? ?Return in about 6 months (around 08/23/2022) for TBSE, HxMM, HxBCC's, HxDN. ? ?I,  Emelia Salisbury, CMA, am acting as scribe for Sarina Ser, MD. ?Documentation: I have reviewed the above documentation for accuracy and completeness, and I agree with the above. ? ?Sarina Ser, MD ? ?

## 2022-02-24 ENCOUNTER — Telehealth: Payer: Self-pay

## 2022-02-24 NOTE — Telephone Encounter (Signed)
Patient advised of BX results .aw 

## 2022-02-24 NOTE — Telephone Encounter (Signed)
-----   Message from Ralene Bathe, MD sent at 02/24/2022  1:30 PM EDT ----- ?Diagnosis ?Skin , right medial pretibial ?DYSPLASTIC COMPOUND NEVUS WITH SEVERE ATYPIA, INFLAMED, CLOSE TO MARGIN, SEE DESCRIPTION ? ?Severe dysplastic nevus ?Margins free, but "close to margin" ?May need additional procedure. ?Recheck next visit ?

## 2022-02-24 NOTE — Telephone Encounter (Signed)
LM on VM please return my call  ? ? ?Severe dysplastic nevus ?Margins free, but "close to margin" ?May need additional procedure. ?Recheck next visit ?  ?

## 2022-03-01 ENCOUNTER — Encounter: Payer: Self-pay | Admitting: Dermatology

## 2022-06-07 ENCOUNTER — Other Ambulatory Visit: Payer: Self-pay | Admitting: Family Medicine

## 2022-06-07 ENCOUNTER — Ambulatory Visit: Admission: RE | Admit: 2022-06-07 | Payer: Medicare PPO | Source: Ambulatory Visit

## 2022-06-07 DIAGNOSIS — R9389 Abnormal findings on diagnostic imaging of other specified body structures: Secondary | ICD-10-CM

## 2022-06-07 DIAGNOSIS — R937 Abnormal findings on diagnostic imaging of other parts of musculoskeletal system: Secondary | ICD-10-CM

## 2022-06-08 ENCOUNTER — Ambulatory Visit
Admission: RE | Admit: 2022-06-08 | Discharge: 2022-06-08 | Disposition: A | Discharge status: Home / Self Care | Payer: Medicare PPO | Source: Ambulatory Visit | Attending: Family Medicine | Admitting: Family Medicine

## 2022-06-08 DIAGNOSIS — R937 Abnormal findings on diagnostic imaging of other parts of musculoskeletal system: Secondary | ICD-10-CM | POA: Diagnosis present

## 2022-06-08 DIAGNOSIS — R9389 Abnormal findings on diagnostic imaging of other specified body structures: Secondary | ICD-10-CM | POA: Insufficient documentation

## 2022-06-16 ENCOUNTER — Ambulatory Visit: Payer: Self-pay | Admitting: Urology

## 2022-09-05 ENCOUNTER — Ambulatory Visit: Payer: Medicare PPO | Admitting: Dermatology

## 2023-06-21 ENCOUNTER — Encounter: Payer: Medicare PPO | Admitting: Dermatology

## 2023-07-31 DIAGNOSIS — C4492 Squamous cell carcinoma of skin, unspecified: Secondary | ICD-10-CM

## 2023-07-31 HISTORY — DX: Squamous cell carcinoma of skin, unspecified: C44.92

## 2023-11-16 ENCOUNTER — Ambulatory Visit: Payer: Medicare PPO | Admitting: Dermatology

## 2023-11-29 ENCOUNTER — Encounter: Payer: Self-pay | Admitting: Dermatology

## 2023-11-29 ENCOUNTER — Ambulatory Visit: Payer: Medicare PPO | Admitting: Dermatology

## 2023-11-29 DIAGNOSIS — L280 Lichen simplex chronicus: Secondary | ICD-10-CM

## 2023-11-29 DIAGNOSIS — D1801 Hemangioma of skin and subcutaneous tissue: Secondary | ICD-10-CM

## 2023-11-29 DIAGNOSIS — Z85828 Personal history of other malignant neoplasm of skin: Secondary | ICD-10-CM

## 2023-11-29 DIAGNOSIS — D229 Melanocytic nevi, unspecified: Secondary | ICD-10-CM

## 2023-11-29 DIAGNOSIS — L821 Other seborrheic keratosis: Secondary | ICD-10-CM

## 2023-11-29 DIAGNOSIS — L820 Inflamed seborrheic keratosis: Secondary | ICD-10-CM | POA: Diagnosis not present

## 2023-11-29 DIAGNOSIS — L814 Other melanin hyperpigmentation: Secondary | ICD-10-CM

## 2023-11-29 DIAGNOSIS — Z86018 Personal history of other benign neoplasm: Secondary | ICD-10-CM

## 2023-11-29 DIAGNOSIS — L309 Dermatitis, unspecified: Secondary | ICD-10-CM

## 2023-11-29 DIAGNOSIS — Z1283 Encounter for screening for malignant neoplasm of skin: Secondary | ICD-10-CM | POA: Diagnosis not present

## 2023-11-29 DIAGNOSIS — Z85820 Personal history of malignant melanoma of skin: Secondary | ICD-10-CM

## 2023-11-29 DIAGNOSIS — L578 Other skin changes due to chronic exposure to nonionizing radiation: Secondary | ICD-10-CM

## 2023-11-29 DIAGNOSIS — W908XXA Exposure to other nonionizing radiation, initial encounter: Secondary | ICD-10-CM

## 2023-11-29 MED ORDER — TACROLIMUS 0.1 % EX OINT: TOPICAL_OINTMENT | CUTANEOUS | 2 refills | Status: AC

## 2023-11-29 MED ORDER — CLOBETASOL PROPIONATE 0.05 % EX CREA: TOPICAL_CREAM | CUTANEOUS | 1 refills | Status: AC

## 2023-11-29 NOTE — Progress Notes (Signed)
Follow Up Visit   Subjective  Tommy Golden is a 73 y.o. male who presents for the following: itchy bumps all over body that started this summer. Patient changed laundry detergent, soap, and put out bed bug traps, but nothing has helped. No one else lives in the house. No new medicines. Patient has used triamcinolone cream in the past.  The patient presents for Total-Body Skin Exam (TBSE) for skin cancer screening and mole check.  The patient has spots, moles and lesions to be evaluated, some may be new or changing and the patient has concerns that these could be cancer. He has a history of melanoma of the right mid back lateral and right posterior axillary mid back lat near side.History of BCCs and multiple dysplastic nevi. He had a growth on the left preauricular area, but went to another dermatologist office to have treated when he couldn't be seen here quickly.   The following portions of the chart were reviewed this encounter and updated as appropriate: medications, allergies, medical history  Review of Systems:  No other skin or systemic complaints except as noted in HPI or Assessment and Plan.  Objective  Well appearing patient in no apparent distress; mood and affect are within normal limits.  A focused examination was performed of the following areas: Face, trunk, extremities  Relevant exam findings are noted in the Assessment and Plan.  Left pretibia Erythematous stuck-on, waxy papule  Assessment & Plan  INFLAMED SEBORRHEIC KERATOSIS Left pretibia Symptomatic, irritating, patient would like treated. Destruction of lesion - Left pretibia  Destruction method: cryotherapy   Informed consent: discussed and consent obtained   Lesion destroyed using liquid nitrogen: Yes   Region frozen until ice ball extended beyond lesion: Yes   Outcome: patient tolerated procedure well with no complications   Post-procedure details: wound care instructions given   Additional details:  Prior  to procedure, discussed risks of blister formation, small wound, skin dyspigmentation, or rare scar following cryotherapy. Recommend Vaseline ointment to treated areas while healing.  LENTIGINES, SEBORRHEIC KERATOSES, HEMANGIOMAS - Benign normal skin lesions - Benign-appearing - Call for any changes  MELANOCYTIC NEVI - Tan-brown and/or pink-flesh-colored symmetric macules and papules - Benign appearing on exam today - Observation - Call clinic for new or changing moles - Recommend daily use of broad spectrum spf 30+ sunscreen to sun-exposed areas.   ACTINIC DAMAGE - Chronic condition, secondary to cumulative UV/sun exposure - diffuse scaly erythematous macules with underlying dyspigmentation - Recommend daily broad spectrum sunscreen SPF 30+ to sun-exposed areas, reapply every 2 hours as needed.  - Staying in the shade or wearing long sleeves, sun glasses (UVA+UVB protection) and wide brim hats (4-inch brim around the entire circumference of the hat) are also recommended for sun protection.  - Call for new or changing lesions.  SKIN CANCER SCREENING PERFORMED TODAY  HISTORY OF BASAL CELL CARCINOMA OF THE SKIN - No evidence of recurrence today - Recommend regular full body skin exams - Recommend daily broad spectrum sunscreen SPF 30+ to sun-exposed areas, reapply every 2 hours as needed.  - Call if any new or changing lesions are noted between office visits  HISTORY OF MELANOMA Right mid back lateral, 2004 Right post axillary mid back lat near side, 2017 - No evidence of recurrence today - No lymphadenopathy - Recommend regular full body skin exams - Recommend daily broad spectrum sunscreen SPF 30+ to sun-exposed areas, reapply every 2 hours as needed.  - Call if any new or changing  lesions are noted between office visits   History of Dysplastic Nevi Multiple, see history - No evidence of recurrence today - Recommend regular full body skin exams - Recommend daily broad  spectrum sunscreen SPF 30+ to sun-exposed areas, reapply every 2 hours as needed.  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  Chronic and persistent condition with duration or expected duration over one year. Condition is bothersome/symptomatic for patient. Currently flared.   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  These areas may be pink, hyperpigmented and may have excoriations.  LSC is commonly observed in uncontrolled atopic dermatitis and other forms of eczema, and in other itchy skin conditions (eg, insect bites, scabies).  Sometimes it is not possible to know initial cause of LSC if it has been present for a long time.  It generally responds well to treatment with high potency topical steroids.  It is important to stop rubbing/scratching the area in order to break the itch-scratch-rash-itch cycle, in order for the rash to resolve.   Treatment Plan: Start Clobetasol cream Apply 1st twice daily to AA rash x 2 weeks, then decrease to once daily. May use glove for occlusion at night. Avoid applying to face, groin, and axilla. Use as directed. Long-term use can cause thinning of the skin. Patient with glaucoma, will discuss with eye doctor before using clobetasol. Avoid touching face/rubbing eyes after application.  Start tacrolimus 0.1% ointment Apply 2nd twice daily to AA rash x 2 weeks, then decrease to once daily until rash improved dsp 60g 2Rf.  Topical steroids (such as triamcinolone, fluocinolone, fluocinonide, mometasone, clobetasol, halobetasol, betamethasone, hydrocortisone) can cause thinning and lightening of the skin if they are used for too long in the same area. Your  physician has selected the right strength medicine for your problem and area affected on the body. Please use your medication only as directed by your physician to prevent side effects.   If not improving on follow-up, will discuss Dupixent injections.   HISTORY OF SKIN CANCER Left preauricular - Tx by Dr Cheree Ditto, will request records. - Clear. Observe for recurrence.  - Call clinic for new or changing lesions.   - Recommend regular skin exams, daily broad-spectrum spf 30+ sunscreen use, and photoprotection.      Return 4-6 weeks, for dermatitis.  ICherlyn Labella, CMA, am acting as scribe for Willeen Niece, MD .   Documentation: I have reviewed the above documentation for accuracy and completeness, and I agree with the above.  Willeen Niece, MD

## 2023-11-29 NOTE — Patient Instructions (Addendum)
Cryotherapy Aftercare  Wash gently with soap and water everyday.   Apply Vaseline and Band-Aid daily until healed.    Start Clobetasol cream Apply 1st twice daily to affected areas rash x 2 weeks, then decrease to once daily x 2 weeks, then stop using. Wear gloves at night after applying clobetasol cream. Avoid applying to face, groin, and axilla. Use as directed. Long-term use can cause thinning of the skin.   Start tacrolimus ointment Apply 2nd twice daily to affected areas rash x 2 weeks, then decrease to once daily until rash improved.   Recommend mild soap and moisturizing cream 1-2 times daily.  Gentle skin care handout provided.   Gentle Skin Care Guide  1. Bathe no more than once a day.  2. Avoid bathing in hot water  3. Use a mild soap like Dove, Vanicream, Cetaphil, CeraVe. Can use Lever 2000 or Cetaphil antibacterial soap  4. Use soap only where you need it. On most days, use it under your arms, between your legs, and on your feet. Let the water rinse other areas unless visibly dirty.  5. When you get out of the bath/shower, use a towel to gently blot your skin dry, don't rub it.  6. While your skin is still a little damp, apply a moisturizing cream such as Vanicream, CeraVe, Cetaphil, Eucerin, Sarna lotion or plain Vaseline Jelly. For hands apply Neutrogena [place] Hand Cream or Excipial Hand Cream.  7. Reapply moisturizer any time you start to itch or feel dry.  8. Sometimes using free and clear laundry detergents can be helpful. Fabric softener sheets should be avoided. Downy Free & Gentle liquid, or any liquid fabric softener that is free of dyes and perfumes, it acceptable to use  9. If your doctor has given you prescription creams you may apply moisturizers over them    Due to recent changes in healthcare laws, you may see results of your pathology and/or laboratory studies on MyChart before the doctors have had a chance to review them. We understand that in some  cases there may be results that are confusing or concerning to you. Please understand that not all results are received at the same time and often the doctors may need to interpret multiple results in order to provide you with the best plan of care or course of treatment. Therefore, we ask that you please give [place] 2 business days to thoroughly review all your results before contacting the office for clarification. Should we see a critical lab result, you will be contacted sooner.   If You Need Anything After Your Visit  If you have any questions or concerns for your doctor, please call our main line at [phone] and press option 4 to reach your doctor's medical assistant. If no one answers, please leave a voicemail as directed and we will return your call as soon as possible. Messages left after 4 pm will be answered the following business day.   You may also send [place] a message via MyChart. We typically respond to MyChart messages within 1-2 business days.  For prescription refills, please ask your pharmacy to contact our office. Our fax number is [phone].  If you have an urgent issue when the clinic is closed that cannot wait until the next business day, you can page your doctor at the number below.    Please note that while we do our best to be available for urgent issues outside of office hours, we are not available 24/7.  If you have an urgent issue and are unable to reach [place], you may choose to seek medical care at your doctor's office, retail clinic, urgent care center, or emergency room.  If you have a medical emergency, please immediately call 911 or go to the emergency department.  Pager Numbers  - Dr. Gwen Pounds: [phone]  - Dr. Roseanne Reno: [phone]  - Dr. Katrinka Blazing: [phone]   In the event of inclement weather, please call our main line at [phone] for an update on the status of any delays or closures.  Dermatology Medication Tips: Please keep the boxes that  topical medications come in in order to help keep track of the instructions about where and how to use these. Pharmacies typically print the medication instructions only on the boxes and not directly on the medication tubes.   If your medication is too expensive, please contact our office at [phone] option 4 or send [place] a message through MyChart.   We are unable to tell what your co-pay for medications will be in advance as this is different depending on your insurance coverage. However, we may be able to find a substitute medication at lower cost or fill out paperwork to get insurance to cover a needed medication.   If a prior authorization is required to get your medication covered by your insurance company, please allow [place] 1-2 business days to complete this process.  Drug prices often vary depending on where the prescription is filled and some pharmacies may offer cheaper prices.  The website www.goodrx.com contains coupons for medications through different pharmacies. The prices here do not account for what the cost may be with help from insurance (it may be cheaper with your insurance), but the website can give you the price if you did not use any insurance.  - You can print the associated coupon and take it with your prescription to the pharmacy.  - You may also stop by our office during regular business hours and pick up a GoodRx coupon card.  - If you need your prescription sent electronically to a different pharmacy, notify our office through [facility] or by phone at [phone] option 4.     Si Usted Necesita Algo Despus de Su Visita  Tambin puede enviarnos un mensaje a travs de Clinical cytogeneticist. Por lo general respondemos a los mensajes de MyChart en el transcurso de 1 a 2 das hbiles.  Para renovar recetas, por favor pida a su farmacia que se ponga en contacto con nuestra oficina. Annie Sable de fax es [place] [phone].  Si tiene un asunto urgente cuando la clnica  est cerrada y que no puede esperar hasta el siguiente da hbil, puede llamar/localizar a su doctor(a) al nmero que aparece a continuacin.   Por favor, tenga en cuenta que aunque hacemos todo lo posible para estar disponibles para asuntos urgentes fuera del horario de [place], no estamos disponibles las 24 horas del da, los 7 [address]  [address] de la [place].   Si tiene un problema urgente y no puede comunicarse con nosotros, puede optar por buscar atencin mdica  en el consultorio de su doctor(a), en una clnica privada, en un centro de atencin urgente o en una sala de emergencias.  Si tiene Engineer, drilling, por favor llame inmediatamente al 911 o vaya a la sala de emergencias.  Nmeros de bper  - Dr. Gwen Pounds: [phone]  - Dra. Roseanne Reno: [phone]  - Dr. Katrinka Blazing: [phone]   En caso de inclemencias del tiempo, por favor llame a Ferne Coe  lnea principal al [phone] para una actualizacin sobre el [place] de cualquier retraso o cierre.  Consejos para la medicacin en dermatologa: Por favor, guarde las cajas en las que vienen los medicamentos de uso tpico para ayudarle a seguir las instrucciones sobre dnde y cmo usarlos. Las farmacias generalmente imprimen las instrucciones del medicamento slo en las cajas y no directamente en los tubos del [place].   Si su medicamento es muy caro, por favor, pngase en contacto con Rolm Gala llamando al [phone] y presione la opcin 4 o envenos un mensaje a travs de Clinical cytogeneticist.   No podemos decirle cul ser su copago por los medicamentos por adelantado ya que esto es diferente dependiendo de la cobertura de su seguro. Sin embargo, es posible que podamos encontrar un medicamento sustituto a Audiological scientist un formulario para que el seguro cubra el medicamento que se considera necesario.   Si se requiere una autorizacin previa para que su compaa de seguros [place] su medicamento, por favor permtanos de 1 a 2 das hbiles para  completar [address]  Los precios de los medicamentos varan con frecuencia dependiendo del Environmental consultant de dnde se surte la receta y alguna farmacias pueden ofrecer precios ms baratos.  El sitio web www.goodrx.com tiene cupones para medicamentos de Health and safety inspector. Los precios aqu no tienen en cuenta lo que podra costar con la ayuda del seguro (puede ser ms barato con su seguro), pero el sitio web puede darle el precio si no utiliz Tourist information centre manager.  - Puede imprimir el cupn correspondiente y llevarlo con su receta a la farmacia.  - Tambin puede pasar por nuestra oficina durante el horario de atencin regular y Education officer, museum una tarjeta de cupones de GoodRx.  - Si necesita que su receta se enve electrnicamente a una farmacia diferente, informe a nuestra oficina a travs de MyChart de [place] o por telfono llamando al [phone] y presione la opcin 4.

## 2023-12-05 ENCOUNTER — Telehealth: Payer: Self-pay

## 2023-12-05 NOTE — Telephone Encounter (Signed)
Medical records from [facility] in media. aw

## 2023-12-27 ENCOUNTER — Ambulatory Visit: Payer: Medicare PPO | Admitting: Dermatology

## 2023-12-27 DIAGNOSIS — L2089 Other atopic dermatitis: Secondary | ICD-10-CM

## 2023-12-27 DIAGNOSIS — L209 Atopic dermatitis, unspecified: Secondary | ICD-10-CM | POA: Diagnosis not present

## 2023-12-27 DIAGNOSIS — L280 Lichen simplex chronicus: Secondary | ICD-10-CM | POA: Diagnosis not present

## 2023-12-27 MED ORDER — OPZELURA 1.5 % EX CREA: TOPICAL_CREAM | CUTANEOUS | 3 refills | Status: AC

## 2023-12-27 NOTE — Progress Notes (Signed)
   Follow-Up Visit   Subjective  Tommy Golden is a 73 y.o. male who presents for the following: Dermatitis with LSC follow up - doing well with Tacrolimus 0.1% ointment and Clobetasol cream QD, but he does have itching on his wrist when he wakes up in the morning. no hx of asthma or allergies  The following portions of the chart were reviewed this encounter and updated as appropriate: medications, allergies, medical history  Review of Systems:  No other skin or systemic complaints except as noted in HPI or Assessment and Plan.  Objective  Well appearing patient in no apparent distress; mood and affect are within normal limits.   A focused examination was performed of the following areas:    Relevant exam findings are noted in the Assessment and Plan.    Assessment & Plan   Atopic dermatitis with LSC   Exam: Mild lichenification and erythema/scale on the hands, palms, and wrist.   Chronic and persistent condition with duration or expected duration over one year. Condition is improving with treatment but not currently at goal.     Atopic dermatitis (eczema) is a chronic, relapsing, pruritic condition that can significantly affect quality of life. It is often associated with allergic rhinitis and/or asthma and can require treatment with topical medications, phototherapy, or in severe cases biologic injectable medication (Dupixent; Adbry) or Oral JAK inhibitors.   Lichen simplex chronicus [facility]) is a persistent itchy area of thickened skin that is induced by chronic rubbing and/or scratching (chronic dermatitis).  These areas may be pink, hyperpigmented and may have excoriations.  LSC is commonly observed in uncontrolled atopic dermatitis and other forms of eczema, and in other itchy skin conditions (eg, insect bites, scabies).  Sometimes it is not possible to know initial cause of LSC if it has been present for a long time.  It generally responds well to treatment with high potency topical  steroids.  It is important to stop rubbing/scratching the area in order to break the itch-scratch-rash-itch cycle, in order for the rash to resolve.    Treatment Plan: Doreatha Martin current tube then D/C clobetasol cream. May use PRN up to two weeks PRN flare. Finish current tube then D/C tacrolimus 0.1% ointment  Start Opzelura cream BID. If not covered or too expensive patient to refill Tacrolimus 0.1% ointment and use that instead.   Topical steroids (such as triamcinolone, fluocinolone, fluocinonide, mometasone, clobetasol, halobetasol, betamethasone, hydrocortisone) can cause thinning and lightening of the skin if they are used for too long in the same area. Your physician has selected the right strength medicine for your problem and area affected on the body. Please use your medication only as directed by your physician to prevent side effects.   Use a mild soap when washing hands followed by moisturizer  Consider Dupixent injections if patient continues to flare.   Return in about 6 months (around 06/25/2024) for TBSE with Dr. Gwen Pounds -  hx MM, MMIS, SCC, BCC, dysplastic nevi .  Maylene Roes, CMA, am acting as scribe for Willeen Niece, MD .   Documentation: I have reviewed the above documentation for accuracy and completeness, and I agree with the above.  Willeen Niece, MD

## 2023-12-27 NOTE — Patient Instructions (Signed)

## 2024-01-01 ENCOUNTER — Encounter: Payer: Self-pay | Admitting: Dermatology

## 2024-07-03 ENCOUNTER — Ambulatory Visit: Payer: Medicare PPO | Admitting: Dermatology

## 2024-07-03 ENCOUNTER — Encounter: Payer: Self-pay | Admitting: Dermatology

## 2024-07-03 DIAGNOSIS — L821 Other seborrheic keratosis: Secondary | ICD-10-CM

## 2024-07-03 DIAGNOSIS — L280 Lichen simplex chronicus: Secondary | ICD-10-CM

## 2024-07-03 DIAGNOSIS — W908XXA Exposure to other nonionizing radiation, initial encounter: Secondary | ICD-10-CM | POA: Diagnosis not present

## 2024-07-03 DIAGNOSIS — Z7189 Other specified counseling: Secondary | ICD-10-CM

## 2024-07-03 DIAGNOSIS — Z86018 Personal history of other benign neoplasm: Secondary | ICD-10-CM

## 2024-07-03 DIAGNOSIS — D229 Melanocytic nevi, unspecified: Secondary | ICD-10-CM

## 2024-07-03 DIAGNOSIS — L820 Inflamed seborrheic keratosis: Secondary | ICD-10-CM | POA: Diagnosis not present

## 2024-07-03 DIAGNOSIS — Z85828 Personal history of other malignant neoplasm of skin: Secondary | ICD-10-CM

## 2024-07-03 DIAGNOSIS — L578 Other skin changes due to chronic exposure to nonionizing radiation: Secondary | ICD-10-CM

## 2024-07-03 DIAGNOSIS — L209 Atopic dermatitis, unspecified: Secondary | ICD-10-CM

## 2024-07-03 DIAGNOSIS — D485 Neoplasm of uncertain behavior of skin: Secondary | ICD-10-CM

## 2024-07-03 DIAGNOSIS — Z85820 Personal history of malignant melanoma of skin: Secondary | ICD-10-CM

## 2024-07-03 DIAGNOSIS — L2089 Other atopic dermatitis: Secondary | ICD-10-CM

## 2024-07-03 DIAGNOSIS — D2271 Melanocytic nevi of right lower limb, including hip: Secondary | ICD-10-CM | POA: Diagnosis not present

## 2024-07-03 DIAGNOSIS — L814 Other melanin hyperpigmentation: Secondary | ICD-10-CM

## 2024-07-03 DIAGNOSIS — Z1283 Encounter for screening for malignant neoplasm of skin: Secondary | ICD-10-CM

## 2024-07-03 DIAGNOSIS — D489 Neoplasm of uncertain behavior, unspecified: Secondary | ICD-10-CM

## 2024-07-03 DIAGNOSIS — Z79899 Other long term (current) drug therapy: Secondary | ICD-10-CM

## 2024-07-03 NOTE — Progress Notes (Signed)
 Follow-Up Visit   Subjective  Tommy Golden is a 73 y.o. male who presents for the following: Skin Cancer Screening and Full Body Skin Exam Hx of melanoma, hx of bcc, hx of dysplastic nevi, hx of aks, hx of isks  Hx of atopic derm with LSC at hands and wrist   The patient presents for Total-Body Skin Exam (TBSE) for skin cancer screening and mole check. The patient has spots, moles and lesions to be evaluated, some may be new or changing and the patient may have concern these could be cancer.  The following portions of the chart were reviewed this encounter and updated as appropriate: medications, allergies, medical history  Review of Systems:  No other skin or systemic complaints except as noted in HPI or Assessment and Plan.  Objective  Well appearing patient in no apparent distress; mood and affect are within normal limits.  A full examination was performed including scalp, head, eyes, ears, nose, lips, neck, chest, axillae, abdomen, back, buttocks, bilateral upper extremities, bilateral lower extremities, hands, feet, fingers, toes, fingernails, and toenails. All findings within normal limits unless otherwise noted below.   Relevant physical exam findings are noted in the Assessment and Plan.  trunk and neck x 19, back x 15, left popliteal x 1 (35) Erythematous stuck-on, waxy papule or plaque right mid to upper back paraspinal 0.6 cm brown macule   right middle calf 0.5 cm brown macule   Assessment & Plan   SKIN CANCER SCREENING PERFORMED TODAY.  ACTINIC DAMAGE - Chronic condition, secondary to cumulative UV/sun exposure - diffuse scaly erythematous macules with underlying dyspigmentation - Recommend daily broad spectrum sunscreen SPF 30+ to sun-exposed areas, reapply every 2 hours as needed.  - Staying in the shade or wearing long sleeves, sun glasses (UVA+UVB protection) and wide brim hats (4-inch brim around the entire circumference of the hat) are also recommended for  sun protection.  - Call for new or changing lesions.  LENTIGINES, SEBORRHEIC KERATOSES, HEMANGIOMAS - Benign normal skin lesions - Benign-appearing - Call for any changes  MELANOCYTIC NEVI - Tan-brown and/or pink-flesh-colored symmetric macules and papules - Benign appearing on exam today - Observation - Call clinic for new or changing moles - Recommend daily use of broad spectrum spf 30+ sunscreen to sun-exposed areas.   Atopic dermatitis with LSC  Exam: Clear at exam Chronic condition with duration or expected duration over one year. Currently well-controlled. Atopic dermatitis (eczema) is a chronic, relapsing, pruritic condition that can significantly affect quality of life. It is often associated with allergic rhinitis and/or asthma and can require treatment with topical medications, phototherapy, or in severe cases biologic injectable medication (Dupixent; Adbry) or Oral JAK inhibitors.    Lichen simplex chronicus [facility]) is a persistent itchy area of thickened skin that is induced by chronic rubbing and/or scratching (chronic dermatitis).  These areas may be pink, hyperpigmented and may have excoriations.  LSC is commonly observed in uncontrolled atopic dermatitis and other forms of eczema, and in other itchy skin conditions (eg, insect bites, scabies).  Sometimes it is not possible to know initial cause of LSC if it has been present for a long time.  It generally responds well to treatment with high potency topical steroids.  It is important to stop rubbing/scratching the area in order to break the itch-scratch-rash-itch cycle, in order for the rash to resolve.    Treatment Plan: Continue Opzelura  cream bid prn for recurrence. Discussed can send other topicals in if Opzelura  too expensive.  Use a mild soap when washing hands followed by moisturizer  HISTORY OF BASAL CELL CARCINOMA OF THE SKIN 2013 - multiple sites 11/15/2018 left temple  03/10/2016 left zygoma anterior to sideburn   03/05/2015 left zygoma anterior to sideburn  - No evidence of recurrence today - Recommend regular full body skin exams - Recommend daily broad spectrum sunscreen SPF 30+ to sun-exposed areas, reapply every 2 hours as needed.  - Call if any new or changing lesions are noted between office visits   HISTORY OF MELANOMA Right mid back lateral, 2004 Right post axillary mid back lat near side, 2017 - No evidence of recurrence today - No lymphadenopathy - Recommend regular full body skin exams - Recommend daily broad spectrum sunscreen SPF 30+ to sun-exposed areas, reapply every 2 hours as needed.  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 medial pretibial - severe 04/24/2019 left lateral distal deltoid - severe atypia and halo nevus effect close to margin  11/15/2018 - right tricep distal - moderate  06/28/2017 - right infra axillary - mild atypia lateral margin involved  RUQ abdomen near epigastric 8 cm lateral to midline moderate  Left posterior lateral waistline  mild 12/24/2008 left low back paraspinal posterior waistline lateral - several atypia  - No evidence of recurrence today - Recommend regular full body skin exams - Recommend daily broad spectrum sunscreen SPF 30+ to sun-exposed areas, reapply every 2 hours as needed.  - Call if any new or changing lesions are noted between office visits  HISTORY OF SKIN CANCER Left preauricular - Tx by Dr Arlyss - Clear. Observe for recurrence.  - Call clinic for new or changing lesions.   - Recommend regular skin exams, daily broad-spectrum spf 30+ sunscreen use, and photoprotection.   INFLAMED SEBORRHEIC KERATOSIS (35) trunk and neck x 19, back x 15, left popliteal x 1 (35) Symptomatic, irritating, patient would like treated. Destruction of lesion - trunk and neck x 19, back x 15, left popliteal x 1 (35) Complexity: simple   Destruction method: cryotherapy    Informed consent: discussed and consent obtained   Timeout:  patient name, date of birth, surgical site, and procedure verified Lesion destroyed using liquid nitrogen: Yes   Region frozen until ice ball extended beyond lesion: Yes   Outcome: patient tolerated procedure well with no complications   Post-procedure details: wound care instructions given    NEOPLASM OF UNCERTAIN BEHAVIOR (2) right mid to upper back paraspinal Epidermal / dermal shaving  Lesion diameter (cm):  0.6 Informed consent: discussed and consent obtained   Timeout: patient name, date of birth, surgical site, and procedure verified   Procedure prep:  Patient was prepped and draped in usual sterile fashion Prep type:  Isopropyl alcohol Anesthesia: the lesion was anesthetized in a standard fashion   Anesthetic:  1% lidocaine  w/ epinephrine 1-100,000 buffered w/ 8.4% NaHCO3 Instrument used: flexible razor blade   Hemostasis achieved with: pressure, aluminum chloride and electrodesiccation   Outcome: patient tolerated procedure well   Post-procedure details: sterile dressing applied and wound care instructions given   Dressing type: bandage and petrolatum    Specimen 1 - Surgical pathology Differential Diagnosis: r/o nevus vs dysplasia   Check Margins: yes  right middle calf Epidermal / dermal shaving  Lesion diameter (cm):  0.5 Informed consent: discussed and consent obtained   Timeout: patient name, date of birth, surgical site, and procedure verified  Procedure prep:  Patient was prepped and draped in usual sterile fashion Prep type:  Isopropyl alcohol Anesthesia: the lesion was anesthetized in a standard fashion   Anesthetic:  1% lidocaine  w/ epinephrine 1-100,000 buffered w/ 8.4% NaHCO3 Instrument used: flexible razor blade   Hemostasis achieved with: pressure, aluminum chloride and electrodesiccation   Outcome: patient tolerated procedure well   Post-procedure details: sterile dressing applied and  wound care instructions given   Dressing type: bandage and petrolatum    Specimen 2 - Surgical pathology Differential Diagnosis: r/o nevus vs dysplasia   Check Margins: yes R/o nevus vs dysplasia    Return in about 6 months (around 01/03/2025) for TBSE.  IEleanor Blush, CMA, am acting as scribe for Alm Rhyme, MD.   Documentation: I have reviewed the above documentation for accuracy and completeness, and I agree with the above.  Alm Rhyme, MD

## 2024-07-03 NOTE — Patient Instructions (Addendum)
 Biopsy Wound Care Instructions  Leave the original bandage on for 24 hours if possible.  If the bandage becomes soaked or soiled before that time, it is OK to remove it and examine the wound.  A small amount of post-operative bleeding is normal.  If excessive bleeding occurs, remove the bandage, place gauze over the site and apply continuous pressure (no peeking) over the area for 30 minutes. If this does not work, please call our clinic as soon as possible or page your doctor if it is after hours.   Once a day, cleanse the wound with soap and water. It is fine to shower. If a thick crust develops you may use a Q-tip dipped into dilute hydrogen peroxide (mix 1:1 with water) to dissolve it.  Hydrogen peroxide can slow the healing process, so use it only as needed.    After washing, apply petroleum jelly (Vaseline) or an antibiotic ointment if your doctor prescribed one for you, followed by a bandage.    For best healing, the wound should be covered with a layer of ointment at all times. If you are not able to keep the area covered with a bandage to hold the ointment in place, this may mean re-applying the ointment several times a day.  Continue this wound care until the wound has healed and is no longer open.   Itching and mild discomfort is normal during the healing process. However, if you develop pain or severe itching, please call our office.   If you have any discomfort, you can take Tylenol  (acetaminophen ) or ibuprofen as directed on the bottle. (Please do not take these if you have an allergy to them or cannot take them for another reason).  Some redness, tenderness and white or yellow material in the wound is normal healing.  If the area becomes very sore and red, or develops a thick yellow-green material (pus), it may be infected; please notify us .    If you have stitches, return to clinic as directed to have the stitches removed. You will continue wound care for 2-3 days after the stitches  are removed.   Wound healing continues for up to one year following surgery. It is not unusual to experience pain in the scar from time to time during the interval.  If the pain becomes severe or the scar thickens, you should notify the office.    A slight amount of redness in a scar is expected for the first six months.  After six months, the redness will fade and the scar will soften and fade.  The color difference becomes less noticeable with time.  If there are any problems, return for a post-op surgery check at your earliest convenience.  To improve the appearance of the scar, you can use silicone scar gel, cream, or sheets (such as Mederma or Serica) every night for up to one year. These are available over the counter (without a prescription).  Please call our office at [phone] for any questions or concerns.    Cryotherapy Aftercare  Wash gently with soap and water everyday.   Apply Vaseline and Band-Aid daily until healed.   Seborrheic Keratosis  What causes seborrheic keratoses? Seborrheic keratoses are harmless, common skin growths that first appear during adult life.  As time goes by, more growths appear.  Some people may develop a large number of them.  Seborrheic keratoses appear on both covered and uncovered body parts.  They are not caused by sunlight.  The tendency to develop  seborrheic keratoses can be inherited.  They vary in color from skin-colored to gray, brown, or even black.  They can be either smooth or have a rough, warty surface.   Seborrheic keratoses are superficial and look as if they were stuck on the skin.  Under the microscope this type of keratosis looks like layers upon layers of skin.  That is why at times the top layer may seem to fall off, but the rest of the growth remains and re-grows.    Treatment Seborrheic keratoses do not need to be treated, but can easily be removed in the office.  Seborrheic keratoses often cause symptoms when they rub on  clothing or jewelry.  Lesions can be in the way of shaving.  If they become inflamed, they can cause itching, soreness, or burning.  Removal of a seborrheic keratosis can be accomplished by freezing, burning, or surgery. If any spot bleeds, scabs, or grows rapidly, please return to have it checked, as these can be an indication of a skin cancer.    Melanoma ABCDEs  Melanoma is the most dangerous type of skin cancer, and is the leading cause of death from skin disease.  You are more likely to develop melanoma if you: Have light-colored skin, light-colored eyes, or red or blond hair Spend a lot of time in the sun Tan regularly, either outdoors or in a tanning bed Have had blistering sunburns, especially during childhood Have a close family member who has had a melanoma Have atypical moles or large birthmarks  Early detection of melanoma is key since treatment is typically straightforward and cure rates are extremely high if we catch it early.   The first sign of melanoma is often a change in a mole or a new dark spot.  The ABCDE system is a way of remembering the signs of melanoma.  A for asymmetry:  The two halves do not match. B for border:  The edges of the growth are irregular. C for color:  A mixture of colors are present instead of an even brown color. D for diameter:  Melanomas are usually (but not always) greater than 6mm - the size of a pencil eraser. E for evolution:  The spot keeps changing in size, shape, and color.  Please check your skin once per month between visits. You can use a small mirror in front and a large mirror behind you to keep an eye on the back side or your body.   If you see any new or changing lesions before your next follow-up, please call to schedule a visit.  Please continue daily skin protection including broad spectrum sunscreen SPF 30+ to sun-exposed areas, reapplying every 2 hours as needed when you're outdoors.   Staying in the shade or wearing long  sleeves, sun glasses (UVA+UVB protection) and wide brim hats (4-inch brim around the entire circumference of the hat) are also recommended for sun protection.    Due to recent changes in healthcare laws, you may see results of your pathology and/or laboratory studies on MyChart before the doctors have had a chance to review them. We understand that in some cases there may be results that are confusing or concerning to you. Please understand that not all results are received at the same time and often the doctors may need to interpret multiple results in order to provide you with the best plan of care or course of treatment. Therefore, we ask that you please give us  2 business days to thoroughly  review all your results before contacting the office for clarification. Should we see a critical lab result, you will be contacted sooner.   If You Need Anything After Your Visit  If you have any questions or concerns for your doctor, please call our main line at [phone] and press option 4 to reach your doctor's medical assistant. If no one answers, please leave a voicemail as directed and we will return your call as soon as possible. Messages left after 4 pm will be answered the following business day.   You may also send us  a message via MyChart. We typically respond to MyChart messages within 1-2 business days.  For prescription refills, please ask your pharmacy to contact our office. Our fax number is [phone].  If you have an urgent issue when the clinic is closed that cannot wait until the next business day, you can page your doctor at the number below.    Please note that while we do our best to be available for urgent issues outside of office hours, we are not available 24/7.   If you have an urgent issue and are unable to reach us , you may choose to seek medical care at your doctor's office, retail clinic, urgent care center, or emergency room.  If you have a medical emergency, please  immediately call 911 or go to the emergency department.  Pager Numbers  - Dr. Hester: [phone]  - Dr. Jackquline: [phone]  - Dr. Claudene: [phone]   - Dr. Raymund: [phone]  In the event of inclement weather, please call our main line at [phone] for an update on the status of any delays or closures.  Dermatology Medication Tips: Please keep the boxes that topical medications come in in order to help keep track of the instructions about where and how to use these. Pharmacies typically print the medication instructions only on the boxes and not directly on the medication tubes.   If your medication is too expensive, please contact our office at [phone] option 4 or send us  a message through MyChart.   We are unable to tell what your co-pay for medications will be in advance as this is different depending on your insurance coverage. However, we may be able to find a substitute medication at lower cost or fill out paperwork to get insurance to cover a needed medication.   If a prior authorization is required to get your medication covered by your insurance company, please allow us  1-2 business days to complete this process.  Drug prices often vary depending on where the prescription is filled and some pharmacies may offer cheaper prices.  The website www.goodrx.com contains coupons for medications through different pharmacies. The prices here do not account for what the cost may be with help from insurance (it may be cheaper with your insurance), but the website can give you the price if you did not use any insurance.  - You can print the associated coupon and take it with your prescription to the pharmacy.  - You may also stop by our office during regular business hours and pick up a GoodRx coupon card.  - If you need your prescription sent electronically to a different pharmacy, notify our office through [facility] or by phone at [phone] option  4.     Si Usted Necesita Algo Despus de Su Visita  Tambin puede enviarnos un mensaje a travs de Clinical cytogeneticist. Por lo general respondemos a los mensajes de MyChart en el transcurso de 1 a  2 das hbiles.  Para renovar recetas, por favor pida a su farmacia que se ponga en contacto con nuestra oficina. Randi lakes de fax es [place] [phone].  Si tiene un asunto urgente cuando la clnica est cerrada y que no puede esperar hasta el siguiente da hbil, puede llamar/localizar a su doctor(a) al nmero que aparece a continuacin.   Por favor, tenga en cuenta que aunque hacemos todo lo posible para estar disponibles para asuntos urgentes fuera del horario de [place], no estamos disponibles las 24 horas del da, los 7 [address]  [address] de la [place].   Si tiene un problema urgente y no puede comunicarse con nosotros, puede optar por buscar atencin mdica  en el consultorio de su doctor(a), en una clnica privada, en un centro de atencin urgente o en una sala de emergencias.  Si tiene Engineer, drilling, por favor llame inmediatamente al 911 o vaya a la sala de emergencias.  Nmeros de bper  - Dr. Hester: [phone]  - Dra. Jackquline: [phone]  - Dr. Claudene: [phone]  - Dra. Kitts: [phone]  En caso de inclemencias del [place], por favor llame a nuestra lnea principal al [phone] para una actualizacin sobre el estado de cualquier retraso o cierre.  Consejos para la medicacin en dermatologa: Por favor, guarde las cajas en las que vienen los medicamentos de uso tpico para ayudarle a seguir las instrucciones sobre dnde y cmo usarlos. Las farmacias generalmente imprimen las instrucciones del medicamento slo en las cajas y no directamente en los tubos del [place].   Si su medicamento es muy caro, por favor, pngase en contacto con landry rieger llamando al [phone] y presione la opcin 4 o envenos un mensaje a travs de Clinical cytogeneticist.   No podemos decirle cul ser su copago  por los medicamentos por adelantado ya que esto es diferente dependiendo de la cobertura de su seguro. Sin embargo, es posible que podamos encontrar un medicamento sustituto a Audiological scientist un formulario para que el seguro cubra el medicamento que se considera necesario.   Si se requiere una autorizacin previa para que su compaa de seguros malta su medicamento, por favor permtanos de 1 a 2 das hbiles para completar este proceso.  Los precios de los medicamentos varan con frecuencia dependiendo del Environmental consultant de dnde se surte la receta y alguna farmacias pueden ofrecer precios ms baratos.  El sitio web www.goodrx.com tiene cupones para medicamentos de Health and safety inspector. Los precios aqu no tienen en cuenta lo que podra costar con la ayuda del seguro (puede ser ms barato con su seguro), pero el sitio web puede darle el precio si no utiliz Tourist information centre manager.  - Puede imprimir el cupn correspondiente y llevarlo con su receta a la farmacia.  - Tambin puede pasar por nuestra oficina durante el horario de atencin regular y Education officer, museum una tarjeta de cupones de GoodRx.  - Si necesita que su receta se enve electrnicamente a una farmacia diferente, informe a nuestra oficina a travs de MyChart de [place] o por telfono llamando al [phone] y presione la opcin 4.

## 2024-07-05 LAB — SURGICAL PATHOLOGY

## 2024-07-10 ENCOUNTER — Ambulatory Visit: Payer: Self-pay | Admitting: Dermatology

## 2024-07-10 ENCOUNTER — Encounter: Payer: Self-pay | Admitting: Dermatology

## 2024-07-10 NOTE — Telephone Encounter (Addendum)
 Discussed bx results with patient. He verbalized understanding and denied further questions. Will recheck right middle calf at next follow up. Discussed  may need additional procedure.  ----- Message from Alm Rhyme sent at 07/10/2024 10:54 AM EDT ----- FINAL DIAGNOSIS        1. Skin, right mid to upper back paraspinal :       SEBORRHEIC KERATOSIS, IRRITATED        2. 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

## 2024-07-22 ENCOUNTER — Inpatient Hospital Stay: Attending: Oncology | Admitting: Oncology

## 2024-07-22 ENCOUNTER — Encounter: Payer: Self-pay | Admitting: Oncology

## 2024-07-22 ENCOUNTER — Inpatient Hospital Stay

## 2024-07-22 VITALS — BP 114/70 | HR 67 | Temp 97.6°F | Resp 16 | Wt 159.3 lb

## 2024-07-22 DIAGNOSIS — Z862 Personal history of diseases of the blood and blood-forming organs and certain disorders involving the immune mechanism: Secondary | ICD-10-CM | POA: Diagnosis not present

## 2024-07-22 DIAGNOSIS — Z09 Encounter for follow-up examination after completed treatment for conditions other than malignant neoplasm: Secondary | ICD-10-CM | POA: Diagnosis not present

## 2024-07-22 LAB — CBC WITH DIFFERENTIAL/PLATELET
Abs Immature Granulocytes: 0.04 K/uL (ref 0.00–0.07)
Basophils Absolute: 0 K/uL (ref 0.0–0.1)
Basophils Relative: 0 %
Eosinophils Absolute: 0.2 K/uL (ref 0.0–0.5)
Eosinophils Relative: 4 %
HCT: 41.5 % (ref 39.0–52.0)
Hemoglobin: 14.5 g/dL (ref 13.0–17.0)
Immature Granulocytes: 1 %
Lymphocytes Relative: 31 %
Lymphs Abs: 1.4 K/uL (ref 0.7–4.0)
MCH: 32.2 pg (ref 26.0–34.0)
MCHC: 34.9 g/dL (ref 30.0–36.0)
MCV: 92.2 fL (ref 80.0–100.0)
Monocytes Absolute: 0.4 K/uL (ref 0.1–1.0)
Monocytes Relative: 8 %
Neutro Abs: 2.7 K/uL (ref 1.7–7.7)
Neutrophils Relative %: 56 %
Platelets: 160 K/uL (ref 150–400)
RBC: 4.5 MIL/uL (ref 4.22–5.81)
RDW: 12.3 % (ref 11.5–15.5)
WBC: 4.7 K/uL (ref 4.0–10.5)
nRBC: 0 % (ref 0.0–0.2)

## 2024-07-22 LAB — PLATELET FUNCTION ASSAY: Collagen / Epinephrine: 149 s (ref 0–193)

## 2024-07-22 NOTE — Progress Notes (Signed)
 [name]  [phone] [phone]  ID: Tommy Golden OB: [birth year]  MR#: [id number]  RDW#:[id number]  Patient Care Team: Valora Agent, MD as PCP - General (Family Medicine)  CHIEF COMPLAINT: History of thrombocytopenia  INTERVAL HISTORY: Patient is a 73 year old male who is who is undergoing corneal transplant surgery in the near future and self-referred for evaluation of a history of thrombocytopenia as well as a bleeding episode with glaucoma surgery 15 years ago.  He currently feels well.  He has no neurologic complaints.  He denies any recent fevers or illnesses.  He has a good appetite and denies weight loss.  He has no chest pain, shortness of breath, cough, or hemoptysis.  He denies any nausea, vomiting, constipation, or diarrhea.  He has no urinary complaints.  Patient offers no further specific complaints today.  REVIEW OF SYSTEMS:   Review of Systems  Constitutional: Negative.  Negative for fever, malaise/fatigue and weight loss.  Respiratory: Negative.  Negative for cough, hemoptysis and shortness of breath.   Cardiovascular: Negative.  Negative for chest pain and leg swelling.  Gastrointestinal: Negative.  Negative for abdominal pain.  Genitourinary: Negative.  Negative for dysuria.  Musculoskeletal: Negative.  Negative for back pain.  Skin: Negative.  Negative for rash.  Neurological: Negative.  Negative for dizziness, focal weakness, weakness and headaches.  Psychiatric/Behavioral: Negative.  The patient is not nervous/anxious.     As per HPI. Otherwise, a complete review of systems is negative.  PAST MEDICAL HISTORY: Past Medical History:  Diagnosis Date   Arthritis    Basal cell carcinoma 03/05/2015   Left zygoma ant. to sideburn. Superficial and nodular pattern.   Basal cell carcinoma 03/10/2016   Left zygoma ant. to sideburn. Superficial   Basal cell carcinoma 11/15/2018   Left temple. Infiltrative pattern   Basal  cell carcinoma    nose, Mohs   Cataract    Dysplastic nevus 12/24/2008   Left low back paraspinal post waistline lat.. Severe atypia. Excised: 02/25/2009, margins free.   Dysplastic nevus 06/28/2017   RUQ abdomen near epigastric 8.0cmlat to midline. Moderate atypia, lat. margin involved   Dysplastic nevus 06/28/2017   Right infra axillary. Mild atypia. Lateral margin involved   Dysplastic nevus 06/28/2017   Left post. lat. wasitline. Mild atypia. Deep margin involved   Dysplastic nevus 11/15/2018   Right tricep distal. Moderate atypia. Limited margins free.   Dysplastic nevus 04/24/2019   Left lat. distal deltoid. Severe atypia and halo nevus effect. Close to margin. Excised: 06/11/2019. Margins free   Dysplastic nevus 02/21/2022   right med pretibial, severe   Dysplastic nevus 07/03/2024   right middle calf - mod to severe - margins free but margin close may need additional procedure   Glaucoma    Hx of basal cell carcinoma 2013   multiple sites    Hx of dysplastic nevus 12/24/2008   Left low back paraspinal post. waistline med. Moderate atypia. Margin extends to one edge. Excised: 02/25/2009, margins free.   Hx of melanoma of skin 2004   multiple sites    Hypertension    Melanoma (HCC) 09/26/2016   Right posterior axillary mid back lat. near side. MM arising in a nevus. SS. Tumor thickness: 0.40mm. Anatomic level: IV. Excised: 10/11/2016, margins free   Melanoma (HCC) 2004   Right mid back lateral   SCC (squamous cell carcinoma) 07/31/2023   L temple, excised by Dr. Arlyss    PAST SURGICAL HISTORY: Past Surgical History:  Procedure Laterality  Date   AQUEOUS SHUNT     insertion   CHOLECYSTECTOMY     COLONOSCOPY WITH PROPOFOL  N/A 08/06/2019   Procedure: COLONOSCOPY WITH PROPOFOL ;  Surgeon: Toledo, Ladell POUR, MD;  Location: [facility] ENDOSCOPY;  Service: Gastroenterology;  Laterality: N/A;   CORONARY ARTERY BYPASS GRAFT     cardiac stents x2   CORONARY STENT INTERVENTION N/A 05/12/2017    Procedure: Coronary Stent Intervention;  Surgeon: Darron Deatrice LABOR, MD;  Location: [facility] INVASIVE CV LAB;  Service: Cardiovascular;  Laterality: N/A;   EYE SURGERY     GLAUCOMA SURGERY     LEFT HEART CATH AND CORONARY ANGIOGRAPHY N/A 05/12/2017   Procedure: Left Heart Cath and Coronary Angiography;  Surgeon: Bosie Vinie LABOR, MD;  Location: [facility] INVASIVE CV LAB;  Service: Cardiovascular;  Laterality: N/A;    FAMILY HISTORY: History reviewed. No pertinent family history.  ADVANCED DIRECTIVES (Y/N):  N  HEALTH MAINTENANCE: Social History   Tobacco Use   Smoking status: Never   Smokeless tobacco: Never  Vaping Use   Vaping status: Never Used  Substance Use Topics   Alcohol use: No   Drug use: No     Colonoscopy:  PAP:  Bone density:  Lipid panel:  Allergies  Allergen Reactions   Prednisone Other (See Comments)    Other Reaction: increase IOP   Penicillins Rash    Has patient had a PCN reaction causing immediate rash, facial/tongue/throat swelling, SOB or lightheadedness with hypotension: No Has patient had a PCN reaction causing severe rash involving mucus membranes or skin necrosis: No Has patient had a PCN reaction that required hospitalization: No Has patient had a PCN reaction occurring within the last 10 years: No If all of the above answers are NO, then may proceed with Cephalosporin use.    Brimonidine Other (See Comments)    Redness   Brimonidine Tartrate-Timolol Other (See Comments)    redness   Dorzolamide Other (See Comments)    Redness   Dorzolamide Hcl-Timolol Mal Other (See Comments)    Redness   Latanoprost  Other (See Comments)    redness   Timolol Maleate Other (See Comments)    redness    Current Outpatient Medications  Medication Sig Dispense Refill   ALPRAZolam  (XANAX  XR) 1 MG 24 hr tablet Take 1 mg by mouth 2 (two) times daily.     atenolol  (TENORMIN ) 25 MG tablet Take 25 mg by mouth 2 (two) times daily.     atorvastatin  (LIPITOR) 20 MG  tablet Take 1 tablet (20 mg total) by mouth daily at 6 PM. 30 tablet 11   clonazePAM (KLONOPIN) 1 MG tablet      enalapril  (VASOTEC ) 10 MG tablet Take 10 mg by mouth 2 (two) times daily.     gemfibrozil  (LOPID ) 600 MG tablet Take 600 mg by mouth daily.     oxymetazoline  (AFRIN) 0.05 % nasal spray Place 1 spray into both nostrils at bedtime.     Ruxolitinib Phosphate  (OPZELURA ) 1.5 % CREA Apply to aa's eczema BID. 60 g 3   sertraline  (ZOLOFT ) 100 MG tablet Take 150 mg by mouth daily. Takes 1.5 tablet     tacrolimus  (PROTOPIC ) 0.1 % ointment Apply to affected areas rash once to twice daily as directed. 60 g 2   TRAVATAN Z 0.004 % SOLN ophthalmic solution Place 1 drop into both eyes at bedtime.  2   vitamin k 100 MCG tablet Take 100 mcg by mouth daily.     clobetasol  cream (TEMOVATE ) 0.05 %  Apply to affected areas rash once to twice daily as directed. Avoid applying to face, groin, and axilla. Use as directed. Long-term use can cause thinning of the skin. (Patient not taking: Reported on 07/22/2024) 60 g 1   No current facility-administered medications for this visit.    OBJECTIVE: Vitals:   07/22/24 1319  BP: 114/70  Pulse: 67  Resp: 16  Temp: 97.6 F (36.4 C)  SpO2: 99%     Body mass index is 24.95 kg/m.    ECOG FS:0 - Asymptomatic  General: Well-developed, well-nourished, no acute distress. Eyes: Pink conjunctiva, anicteric sclera. HEENT: Normocephalic, moist mucous membranes. Lungs: No audible wheezing or coughing. Heart: Regular rate and rhythm. Abdomen: Soft, nontender, no obvious distention. Musculoskeletal: No edema, cyanosis, or clubbing. Neuro: Alert, answering all questions appropriately. Cranial nerves grossly intact. Skin: No rashes or petechiae noted. Psych: Normal affect. Lymphatics: No cervical, calvicular, axillary or inguinal LAD.   LAB RESULTS:  Lab Results  Component Value Date   NA 136 05/13/2017   K 4.1 05/13/2017   CL 104 05/13/2017   CO2 26  05/13/2017   GLUCOSE 105 (H) 05/13/2017   BUN 18 05/13/2017   CREATININE 0.78 05/13/2017   CALCIUM  8.8 (L) 05/13/2017   GFRNONAA >60 05/13/2017   GFRAA >60 05/13/2017    Lab Results  Component Value Date   WBC 4.7 07/22/2024   NEUTROABS 2.7 07/22/2024   HGB 14.5 07/22/2024   HCT 41.5 07/22/2024   MCV 92.2 07/22/2024   PLT 160 07/22/2024     STUDIES: No results found.  ASSESSMENT: History of thrombocytopenia.  PLAN:    Upon review of patient's chart, he had a platelet count of 133 in April 2025, but otherwise his platelet count appears to be within normal limits.  He reports an extensive bleeding episode during her glaucoma surgery over 15 years ago, but no other evidence of bleeding.  His platelet count is within normal limits today at 160.  PFA was ordered for completeness and is pending at time of dictation.  No further intervention is needed.  Patient does not require follow-up. Corneal transplant surgery: Okay from hematology standpoint to proceed with surgery as scheduled.  I spent a total of 45 minutes reviewing chart data, face-to-face evaluation with the patient, counseling and coordination of care as detailed above.   Patient expressed understanding and was in agreement with this plan. He also understands that He can call clinic at any time with any questions, concerns, or complaints.     Evalene JINNY Reusing, MD   07/22/2024 2:50 PM

## 2025-01-01 ENCOUNTER — Ambulatory Visit: Admitting: Dermatology
